# Patient Record
Sex: Female | Born: 2000 | Hispanic: Yes | Marital: Single | Smoking: Former smoker
Health system: Southern US, Community
[De-identification: ages and names within clinical notes are randomized; demographics above are authoritative.]

## PROBLEM LIST (undated history)

## (undated) DIAGNOSIS — R55 Syncope and collapse: Secondary | ICD-10-CM

## (undated) DIAGNOSIS — N390 Urinary tract infection, site not specified: Secondary | ICD-10-CM

## (undated) DIAGNOSIS — F329 Major depressive disorder, single episode, unspecified: Secondary | ICD-10-CM

## (undated) DIAGNOSIS — Z789 Other specified health status: Secondary | ICD-10-CM

## (undated) DIAGNOSIS — R300 Dysuria: Secondary | ICD-10-CM

## (undated) DIAGNOSIS — B349 Viral infection, unspecified: Secondary | ICD-10-CM

## (undated) DIAGNOSIS — U071 COVID-19: Secondary | ICD-10-CM

## (undated) DIAGNOSIS — R4586 Emotional lability: Secondary | ICD-10-CM

## (undated) HISTORY — DX: Urinary tract infection, site not specified: N39.0

## (undated) HISTORY — DX: Viral infection, unspecified: B34.9

## (undated) HISTORY — DX: Emotional lability: R45.86

## (undated) HISTORY — PX: NO PAST SURGERIES: SHX2092

## (undated) HISTORY — DX: Other specified health status: Z78.9

## (undated) HISTORY — PX: EYE EXAMINATION UNDER ANESTHESIA: SHX1560

## (undated) HISTORY — DX: COVID-19: U07.1

## (undated) HISTORY — DX: Dysuria: R30.0

## (undated) HISTORY — DX: Major depressive disorder, single episode, unspecified: F32.9

## (undated) HISTORY — DX: Syncope and collapse: R55

---
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
## 2021-06-02 ENCOUNTER — Other Ambulatory Visit: Payer: Self-pay

## 2021-06-02 ENCOUNTER — Ambulatory Visit (INDEPENDENT_AMBULATORY_CARE_PROVIDER_SITE_OTHER): Payer: Self-pay

## 2021-06-02 VITALS — BP 109/71 | HR 80 | Temp 98.2°F | Ht 62.0 in | Wt 117.0 lb

## 2021-06-02 DIAGNOSIS — Z3480 Encounter for supervision of other normal pregnancy, unspecified trimester: Secondary | ICD-10-CM

## 2021-06-02 DIAGNOSIS — Z3A19 19 weeks gestation of pregnancy: Secondary | ICD-10-CM

## 2021-06-02 MED ORDER — PREPLUS 27-1 MG PO TABS: 1.0000 | ORAL_TABLET | Freq: Every day | ORAL | 12 refills | Status: DC

## 2021-06-02 NOTE — Progress Notes (Signed)
at   I discussed the limitations, risks, security and privacy concerns of performing an evaluation and management service by telephone and the availability of in person appointments. I also discussed with the patient that there may be a patient responsible charge related to this service. The patient expressed understanding and agreed to proceed.   Location: [facility] Patient: clinic Provider: clinic   PRENATAL INTAKE SUMMARY  [name] presents today New OB Nurse Interview.  OB History     Gravida  1   Para      Term      Preterm      AB      Living         SAB      IAB      Ectopic      Multiple      Live Births             I have reviewed the patient's medical, obstetrical, social, and family histories, medications, and available lab results.  SUBJECTIVE She has no unusual complaints  OBJECTIVE Initial Nurse interview for history/labs (New OB)  last menstrual period date: 01/17/2021 EDD: 10/24/2021  GA: [redacted]w[redacted]d G1P0 FHT: 149  GENERAL APPEARANCE: alert, well appearing, in no apparent distress, oriented to person, place and time   ASSESSMENT Normal pregnancy  PLAN Prenatal care:  [facility] Pnl/HIV/Hep C OB Urine Culture GC/CT to be done at next visit  PAPapproximate date 04/2021 and was normal will bring in results to be scanned HgbEval/SMA/CF ([facility]) Panorama A1C   Blood Pressure Monitor/Weight Scale BP cuff given to pt  Weight Scale at home  MyChart/Babyscripts MyChart access verified. I explained pt will have some visits in office and some virtually. Babyscripts instructions given and order placed.   Placed OB Box on problem list and updated   Followup with Raelyn Mora, CNM on 06/12/2021 at 3:55   Follow Up Instructions:   I discussed the assessment and treatment plan with the patient. The patient was provided an opportunity to ask questions and all were answered. The patient agreed with the plan and  demonstrated an understanding of the instructions.   The patient was advised to call back or seek an in-person evaluation if the symptoms worsen or if the condition fails to improve as anticipated.  I provided 40 minutes of  face-to-face time during this encounter.  Gearldine Shown, CMA

## 2021-06-02 NOTE — Patient Instructions (Signed)
 Please remember that if you are not able to keep your appointment to call the office and reschedule, or cancel. If you no-show or cancel with less than 24 hour notice we will charge you, not your insurance a $50 fee.   AREA PEDIATRIC/FAMILY PRACTICE PHYSICIANS  Tammie Scott [address] [place], [place] [address] Phone - [phone]   Fax - [phone]  Tammie Scott [address] [place], [place]  [address] Phone - [phone]   Fax - [phone]  [facility] [address] [place], [place]  [address] Phone - [phone]   Fax - [phone]  [facility] [address] [place], [place]  [address] Phone - [phone]   Fax - [phone]  [facility] [address] [place], [place]  [address] Phone - [phone]   Fax - [phone]  [facility] [address], [address] [place], [place]  [address] Phone - [phone]   Fax - [phone]  [facility] [address] [place], [place]  [address] Phone - [phone]   Fax - [phone]  [facility] [address], [address] [place], [place]  [address] Phone - [phone]   Fax - [phone]  [facility] [address] [place], [place]  [address] Phone - [phone]   Fax - [phone] [facility] [address] [place], [place]  [address] Phone - [phone]   Fax - [phone]  [facility] [address] [place], [place]  [address] Phone - [phone]   Fax - [phone]  [facility] [address] [place], [place]  [address] Phone - [phone]   Fax - [phone]  [facility] [address] [place], [place]  [address] Phone - [phone]   Fax - [phone]  Tammie Scott [address] [place], [place]  [address] Phone - [phone]  [facility] [address] [place], [place]  [address] Phone - [phone]   Fax - [phone]  [facility] [address] [place], [place]  [address] Phone - [phone]   Fax - [phone]  [facility] [address] [place], [place]  [address] Phone - [phone]   Fax - [phone]  [facility] [address] [place], [place]  [address] Phone - [phone]   Fax - [phone]   [facility] [address] [place], [place]  [address] Phone - [phone]   Fax - [phone]  [facility] [address] , [place]  [address] Phone - [phone]   Fax - [phone]  Tammie Scott [address] , [place]  [address] Phone - [phone]   Fax - [phone]  [facility] [address] , [place]  [address] Phone - [phone]   Fax - [phone]  [facility] [address] , [place]  [address] Phone - [phone]   Fax - [phone] [facility] [address] [place], [place]  [address] Phone - [phone]   Fax - [phone]  [facility] [address] [place], [place]  [address] Phone - [phone]   Fax - [phone]  [facility] [address] [place],     [address] Phone - [phone]   Fax - [phone]

## 2021-06-04 LAB — OBSTETRIC PANEL, INCLUDING HIV
Antibody Screen: NEGATIVE
Basophils Absolute: 0.1 10*3/uL (ref 0.0–0.2)
Basos: 1 %
EOS (ABSOLUTE): 0.1 10*3/uL (ref 0.0–0.4)
Eos: 1 %
HIV Screen 4th Generation wRfx: NONREACTIVE
Hematocrit: 40.1 % (ref 34.0–46.6)
Hemoglobin: 13.9 g/dL (ref 11.1–15.9)
Hepatitis B Surface Ag: NEGATIVE
Immature Grans (Abs): 0 10*3/uL (ref 0.0–0.1)
Immature Granulocytes: 0 %
Lymphocytes Absolute: 1.5 10*3/uL (ref 0.7–3.1)
Lymphs: 19 %
MCH: 32 pg (ref 26.6–33.0)
MCHC: 34.7 g/dL (ref 31.5–35.7)
MCV: 92 fL (ref 79–97)
Monocytes Absolute: 0.4 10*3/uL (ref 0.1–0.9)
Monocytes: 5 %
Neutrophils Absolute: 5.9 10*3/uL (ref 1.4–7.0)
Neutrophils: 74 %
Platelets: 142 10*3/uL — ABNORMAL LOW (ref 150–450)
RBC: 4.34 x10E6/uL (ref 3.77–5.28)
RDW: 13.1 % (ref 11.7–15.4)
RPR Ser Ql: REACTIVE — AB
Rh Factor: POSITIVE
Rubella Antibodies, IGG: 1.86 index (ref >0.99)
WBC: 8 10*3/uL (ref 3.4–10.8)

## 2021-06-04 LAB — HEMOGLOBIN A1C
Est. average glucose Bld gHb Est-mCnc: 100 mg/dL
Hgb A1c MFr Bld: 5.1 % (ref 4.8–5.6)

## 2021-06-04 LAB — URINE CULTURE, OB REFLEX

## 2021-06-04 LAB — RPR, QUANT+TP ABS (REFLEX)
Rapid Plasma Reagin, Quant: 1:2 {titer} — ABNORMAL HIGH
T Pallidum Abs: NONREACTIVE

## 2021-06-04 LAB — CULTURE, OB URINE

## 2021-06-04 LAB — HEPATITIS C ANTIBODY: Hep C Virus Ab: 0.1 s/co ratio (ref 0.0–0.9)

## 2021-06-10 ENCOUNTER — Emergency Department (HOSPITAL_COMMUNITY)
Admission: EM | Admit: 2021-06-10 | Discharge: 2021-06-11 | Disposition: A | Discharge status: Home / Self Care | Payer: Self-pay | Attending: Emergency Medicine | Admitting: Emergency Medicine

## 2021-06-10 ENCOUNTER — Emergency Department (HOSPITAL_BASED_OUTPATIENT_CLINIC_OR_DEPARTMENT_OTHER): Payer: Self-pay

## 2021-06-10 ENCOUNTER — Emergency Department (HOSPITAL_COMMUNITY): Payer: Self-pay

## 2021-06-10 ENCOUNTER — Other Ambulatory Visit: Payer: Self-pay

## 2021-06-10 ENCOUNTER — Encounter (HOSPITAL_COMMUNITY): Payer: Self-pay

## 2021-06-10 DIAGNOSIS — X58XXXA Exposure to other specified factors, initial encounter: Secondary | ICD-10-CM | POA: Insufficient documentation

## 2021-06-10 DIAGNOSIS — R55 Syncope and collapse: Secondary | ICD-10-CM | POA: Insufficient documentation

## 2021-06-10 DIAGNOSIS — Z3A20 20 weeks gestation of pregnancy: Secondary | ICD-10-CM

## 2021-06-10 DIAGNOSIS — O9A219 Injury, poisoning and certain other consequences of external causes complicating pregnancy, unspecified trimester: Secondary | ICD-10-CM

## 2021-06-10 DIAGNOSIS — R61 Generalized hyperhidrosis: Secondary | ICD-10-CM | POA: Insufficient documentation

## 2021-06-10 DIAGNOSIS — S0093XA Contusion of unspecified part of head, initial encounter: Secondary | ICD-10-CM | POA: Insufficient documentation

## 2021-06-10 DIAGNOSIS — R1032 Left lower quadrant pain: Secondary | ICD-10-CM | POA: Insufficient documentation

## 2021-06-10 DIAGNOSIS — O26892 Other specified pregnancy related conditions, second trimester: Secondary | ICD-10-CM | POA: Insufficient documentation

## 2021-06-10 DIAGNOSIS — O9A212 Injury, poisoning and certain other consequences of external causes complicating pregnancy, second trimester: Secondary | ICD-10-CM

## 2021-06-10 DIAGNOSIS — Y9259 Other trade areas as the place of occurrence of the external cause: Secondary | ICD-10-CM | POA: Insufficient documentation

## 2021-06-10 DIAGNOSIS — Z87891 Personal history of nicotine dependence: Secondary | ICD-10-CM | POA: Insufficient documentation

## 2021-06-10 LAB — CBC WITH DIFFERENTIAL/PLATELET
Abs Immature Granulocytes: 0.02 10*3/uL (ref 0.00–0.07)
Basophils Absolute: 0 10*3/uL (ref 0.0–0.1)
Basophils Relative: 1 %
Eosinophils Absolute: 0 10*3/uL (ref 0.0–0.5)
Eosinophils Relative: 1 %
HCT: 37.6 % (ref 36.0–46.0)
Hemoglobin: 12.9 g/dL (ref 12.0–15.0)
Immature Granulocytes: 0 %
Lymphocytes Relative: 21 %
Lymphs Abs: 1.5 10*3/uL (ref 0.7–4.0)
MCH: 32.6 pg (ref 26.0–34.0)
MCHC: 34.3 g/dL (ref 30.0–36.0)
MCV: 94.9 fL (ref 80.0–100.0)
Monocytes Absolute: 0.4 10*3/uL (ref 0.1–1.0)
Monocytes Relative: 6 %
Neutro Abs: 5.1 10*3/uL (ref 1.7–7.7)
Neutrophils Relative %: 71 %
Platelets: 127 10*3/uL — ABNORMAL LOW (ref 150–400)
RBC: 3.96 MIL/uL (ref 3.87–5.11)
RDW: 13.1 % (ref 11.5–15.5)
WBC: 7.2 10*3/uL (ref 4.0–10.5)
nRBC: 0 % (ref 0.0–0.2)

## 2021-06-10 LAB — BASIC METABOLIC PANEL
Anion gap: 9 (ref 5–15)
BUN: 5 mg/dL — ABNORMAL LOW (ref 6–20)
CO2: 24 mmol/L (ref 22–32)
Calcium: 8.7 mg/dL — ABNORMAL LOW (ref 8.9–10.3)
Chloride: 103 mmol/L (ref 98–111)
Creatinine, Ser: 0.42 mg/dL — ABNORMAL LOW (ref 0.44–1.00)
GFR, Estimated: >60 mL/min (ref >60)
Glucose, Bld: 71 mg/dL (ref 70–99)
Potassium: 3.7 mmol/L (ref 3.5–5.1)
Sodium: 136 mmol/L (ref 135–145)

## 2021-06-10 MED ORDER — LACTATED RINGERS IV BOLUS
1000.0000 mL | Freq: Once | INTRAVENOUS | Status: AC
  Administered 2021-06-10: 1000 mL via INTRAVENOUS

## 2021-06-10 NOTE — ED Triage Notes (Signed)
Patient is [redacted] weeks pregnant and had syncopal episode today at drugstore. Patient states that she hasn't felt baby move since fall. Hematoma to back of head. Denies leakage, denis bleeding, reports mild abdominal pain. FHT at triage 160

## 2021-06-10 NOTE — ED Provider Notes (Signed)
[facility] EMERGENCY DEPARTMENT Provider Note   CSN: [id number] Arrival date & time: 06/10/21  1729     History No chief complaint on file.   Tammie Scott is a 20 y.o. female.  Patient who is [redacted] weeks gestation presents today with chief complaint of syncope. She states that same occurred while she was at the [facility] pharmacy earlier today, she started having left lower quadrant abdominal pain, diaphoresis, and tunnel vision and next thing she knew she woke up on the floor. No history of similar, she did not bite her tongue and did not urinate on herself. She states that bystanders told her she was unconscious for less than a minute and that she struck her head on the floor. No seizure like activity endorsed by bystanders. She is now complaining of headache and left lower quadrant abdominal pain. Endorses decreased fetal movement following, but no vaginal bleeding or discharge. She is G1P0 and has had OB/GYN following by [facility].  The history is provided by the patient. No language interpreter was used.      Past Medical History:  Diagnosis Date   Medical history non-contributory     Patient Active Problem List   Diagnosis Date Noted   Supervision of other normal pregnancy, antepartum 06/02/2021    Past Surgical History:  Procedure Laterality Date   NO PAST SURGERIES       OB History     Gravida  1   Para      Term      Preterm      AB      Living         SAB      IAB      Ectopic      Multiple      Live Births              Family History  Problem Relation Age of Onset   Epilepsy Brother    Asthma Brother    Diabetes Maternal Grandfather     Social History   Tobacco Use   Smoking status: Former    Types: Cigarettes    Quit date: 09/2020    Years since quitting: 0.7   Smokeless tobacco: Never  Vaping Use   Vaping Use: Never used  Substance Use Topics   Alcohol use: Never   Drug use: Not  Currently    Types: Marijuana    Comment: Pt hasn't smoked in years    Home Medications Prior to Admission medications   Medication Sig Start Date End Date Taking? Authorizing Provider  phenazopyridine (PYRIDIUM) 200 MG tablet Take 1 tablet (200 mg total) by mouth 3 (three) times daily. Patient not taking: Reported on 06/02/2021 08/13/17   Blue, Marylene Land, PA-C  Prenatal Vit-Fe Fumarate-FA (MULTIVITAMIN-PRENATAL) 27-0.8 MG TABS tablet Take 1 tablet by mouth daily at 12 noon.    [provider]  Prenatal Vit-Fe Fumarate-FA (PREPLUS) 27-1 MG TABS Take 1 tablet by mouth daily. 06/02/21   Raelyn Mora, CNM    Allergies    Patient has no known allergies.  Review of Systems   Review of Systems  Constitutional:  Negative for chills and fever.  Respiratory:  Negative for cough and shortness of breath.   Cardiovascular:  Negative for chest pain.  Gastrointestinal:  Positive for abdominal pain. Negative for diarrhea, nausea and vomiting.  Genitourinary:  Negative for decreased urine volume, dysuria, flank pain, vaginal bleeding, vaginal discharge and vaginal pain.  Musculoskeletal:  Negative for back pain, gait problem, neck pain and neck stiffness.  Neurological:  Positive for syncope and headaches. Negative for dizziness, tremors, facial asymmetry, speech difficulty, weakness, light-headedness and numbness.  Psychiatric/Behavioral:  Negative for confusion and decreased concentration.   All other systems reviewed and are negative.  Physical Exam Updated Vital Signs BP 105/69   Pulse 85   Temp 98.3 F (36.8 C)   Resp 16   LMP 01/17/2021 (Exact Date)   SpO2 100%   Physical Exam Vitals and nursing note reviewed.  Constitutional:      Appearance: Normal appearance. She is normal weight.  HENT:     Head:     Comments: Small hematoma noted to the back of the head with moderate tenderness. Eyes:     Extraocular Movements: Extraocular movements intact.     Pupils: Pupils are  equal, round, and reactive to light.  Cardiovascular:     Rate and Rhythm: Normal rate and regular rhythm.     Heart sounds: Normal heart sounds.  Pulmonary:     Effort: Pulmonary effort is normal. No respiratory distress.     Breath sounds: Normal breath sounds.  Abdominal:     Tenderness: There is no right CVA tenderness, left CVA tenderness, guarding or rebound.     Comments: Gravid abdomen with some tenderness noted in the left lower quadrant  Musculoskeletal:        General: Normal range of motion.     Cervical back: Normal range of motion and neck supple.  Skin:    General: Skin is warm and dry.  Neurological:     General: No focal deficit present.     Mental Status: She is alert.     Gait: Gait normal.  Psychiatric:        Mood and Affect: Mood normal.        Behavior: Behavior normal.    ED Results / Procedures / Treatments   Labs (all labs ordered are listed, but only abnormal results are displayed) Labs Reviewed  CBC WITH DIFFERENTIAL/PLATELET - Abnormal; Notable for the following components:      Result Value   Platelets 127 (*)    All other components within normal limits  BASIC METABOLIC PANEL - Abnormal; Notable for the following components:   BUN 5 (*)    Creatinine, Ser 0.42 (*)    Calcium 8.7 (*)    All other components within normal limits    EKG None  Radiology CT Head Wo Contrast  Result Date: 06/10/2021 CLINICAL DATA:  Recent syncopal episode, initial encounter EXAM: CT HEAD WITHOUT CONTRAST TECHNIQUE: Contiguous axial images were obtained from the base of the skull through the vertex without intravenous contrast. COMPARISON:  None. FINDINGS: Brain: No evidence of acute infarction, hemorrhage, hydrocephalus, extra-axial collection or mass lesion/mass effect. Vascular: No hyperdense vessel or unexpected calcification. Skull: Normal. Negative for fracture or focal lesion. Sinuses/Orbits: No acute finding. Other: None. IMPRESSION: No acute intracranial  abnormality noted. Electronically Signed   By: Alcide Clever M.D.   On: 06/10/2021 23:16    Procedures Procedures   Medications Ordered in ED Medications  lactated ringers bolus 1,000 mL (0 mLs Intravenous Stopped 06/10/21 2322)    ED Course  I have reviewed the triage vital signs and the nursing notes.  Pertinent labs & imaging results that were available during my care of the patient were reviewed by me and considered in my medical decision making (see chart for details).  MDM Rules/Calculators/A&P                         Patient [redacted] weeks gestation presents today following syncopal episode. Triage APP discussed with [facility] APP who states given only 20 weeks that she should be managed in our ED. She endorses headache with left lower quadrant abdominal pain. Labs without leukocytosis or anemia, mild thrombocytopenia at 127 consistent with pregnancy. No electrolyte abnormalities. Vital signs stable without hypertension. Given pain and decreased movement, will get fetal US for evaluation of cardiac activity and rule out placenta previa or abruption. Additionally, discussed with patient in the presence of unclear presentation of syncope with significant headache and hematoma risks vs benefits of CT imaging. Patient understanding and amenable with head CT.  CT head with acute abnormalities. US reveals normal fetal heart rate at 145 bpm with observed cardiac activity. No placental abruption or previa identified. No other acute abnormalities or concerns.  After reevaluation and fluid bolus administration, patient states she is feeling some better. Now endorses some left hip pain which she suspects is from her fall during her syncopal episode, however she is observed to be ambulatory with full ROM, therefore will defer further imaging. No further concerns at this time, feel her syncopal episode was likely due to vasovagal syncope in the setting of pregnancy and compounding dehydration. Plan to discharge  with close OB/GYN follow-up and plenty of oral hydration. She is amenable with plan of discharge, educated on red flag symptoms that would prompt immediate return. Plan to call her OB/GYN tomorrow for appointment. She is afebrile, non-toxic appearing, and in no acute distress. Discharged in stable condition.   This is a shared visit with supervising physician Dr. Silverio Lay who has independently evaluated patient & provided guidance in evaluation/management/disposition, in agreement with care    Final Clinical Impression(s) / ED Diagnoses Final diagnoses:  Vasovagal syncope    Rx / DC Orders ED Discharge Orders     None     An After Visit Summary was printed and given to the patient.    Vear Clock 06/11/21 0053    Charlynne Pander, MD 06/17/21 [id number]

## 2021-06-10 NOTE — ED Provider Notes (Signed)
Emergency Medicine Provider Triage Evaluation Note  Tammie Scott , a 20 y.o. female  was evaluated in triage.  Pt complains of syncope episode. She is currently [redacted]w[redacted]d gestation. No history of seizures. Woke up on the floor at store and heard bystander stay "she's seizing". No urinary incontinence or mouth trauma. Admits to hitting her head. Prior to the syncopal episodes she felt hot, dizzy, and had blurry vision. No chest pain or shortness of breath. No nausea, vomiting, or diarrhea. Patient is followed by [facility]. No pregnancy complications. G1P0. No blood pressure issues thus far in pregnancy. Patient denies vaginal bleeding and fluid from the vagina. Admits to decreased fetal movement since the syncopal episode.   Review of Systems  Positive: syncope Negative: CP  Physical Exam  BP 119/73 (BP Location: Right Arm)   Pulse 81   Temp 98.2 F (36.8 C) (Oral)   Resp 18   LMP 01/17/2021 (Exact Date)   SpO2 100%  Gen:   Awake, no distress   Resp:  Normal effort  MSK:   Moves extremities without difficulty  Other:  Small hematoma to posterior aspect of head, FHR 160s  Medical Decision Making  Medically screening exam initiated at 5:40 PM.  Appropriate orders placed.  Tammie Scott was informed that the remainder of the evaluation will be completed by another provider, this initial triage assessment does not replace that evaluation, and the importance of remaining in the ED until their evaluation is complete.  Spoke to Sam with [facility] who recommends keeping patient in the ED given she is only [redacted] weeks gestation Labs ordered Normal BP in triage   Jesusita Oka 06/10/21 1746    Gwyneth Sprout, MD 06/10/21 2139

## 2021-06-11 NOTE — Discharge Instructions (Signed)
Your workup in the ED this evening was reassuring. Please ensure that you are staying hydrated and call your OB/GYN tomorrow to schedule an appointment.  Return if development of any new or worsening symptoms.

## 2021-06-12 ENCOUNTER — Encounter: Payer: Self-pay | Admitting: Obstetrics and Gynecology

## 2021-06-13 ENCOUNTER — Telehealth: Payer: Self-pay | Admitting: *Deleted

## 2021-06-13 NOTE — Telephone Encounter (Signed)
Patient had called earlier regarding flu like symptoms. Boyfriend tested positive for Flu 06/11/21. She is having bodyaches, temp 99.2, chills, coughing and sore throat. Left voice message for patient to go to urgent care or call PCP. Advised take over the counter Tylenol, Mucinex, Allegra, Zyrtec, Claritin, Benadryl, Robitussin or Sudafed.  Clovis Pu, RN

## 2021-06-14 ENCOUNTER — Ambulatory Visit (HOSPITAL_COMMUNITY)
Admission: EM | Admit: 2021-06-14 | Discharge: 2021-06-14 | Disposition: A | Discharge status: Home / Self Care | Payer: Self-pay | Attending: Medical Oncology | Admitting: Medical Oncology

## 2021-06-14 ENCOUNTER — Inpatient Hospital Stay (HOSPITAL_COMMUNITY)
Admission: AD | Admit: 2021-06-14 | Discharge: 2021-06-14 | Disposition: A | Discharge status: Home / Self Care | Payer: Self-pay | Attending: Obstetrics and Gynecology | Admitting: Obstetrics and Gynecology

## 2021-06-14 ENCOUNTER — Other Ambulatory Visit: Payer: Self-pay

## 2021-06-14 ENCOUNTER — Encounter (HOSPITAL_COMMUNITY): Payer: Self-pay

## 2021-06-14 DIAGNOSIS — Z87891 Personal history of nicotine dependence: Secondary | ICD-10-CM | POA: Insufficient documentation

## 2021-06-14 DIAGNOSIS — Z3480 Encounter for supervision of other normal pregnancy, unspecified trimester: Secondary | ICD-10-CM

## 2021-06-14 DIAGNOSIS — O26892 Other specified pregnancy related conditions, second trimester: Secondary | ICD-10-CM | POA: Insufficient documentation

## 2021-06-14 DIAGNOSIS — Z3490 Encounter for supervision of normal pregnancy, unspecified, unspecified trimester: Secondary | ICD-10-CM

## 2021-06-14 DIAGNOSIS — Z20822 Contact with and (suspected) exposure to covid-19: Secondary | ICD-10-CM | POA: Insufficient documentation

## 2021-06-14 DIAGNOSIS — Z20828 Contact with and (suspected) exposure to other viral communicable diseases: Secondary | ICD-10-CM

## 2021-06-14 DIAGNOSIS — Z3A00 Weeks of gestation of pregnancy not specified: Secondary | ICD-10-CM

## 2021-06-14 DIAGNOSIS — O99512 Diseases of the respiratory system complicating pregnancy, second trimester: Secondary | ICD-10-CM | POA: Insufficient documentation

## 2021-06-14 DIAGNOSIS — Z3A21 21 weeks gestation of pregnancy: Secondary | ICD-10-CM | POA: Insufficient documentation

## 2021-06-14 DIAGNOSIS — J101 Influenza due to other identified influenza virus with other respiratory manifestations: Secondary | ICD-10-CM | POA: Insufficient documentation

## 2021-06-14 DIAGNOSIS — J111 Influenza due to unidentified influenza virus with other respiratory manifestations: Secondary | ICD-10-CM

## 2021-06-14 DIAGNOSIS — R1031 Right lower quadrant pain: Secondary | ICD-10-CM

## 2021-06-14 DIAGNOSIS — R109 Unspecified abdominal pain: Secondary | ICD-10-CM | POA: Insufficient documentation

## 2021-06-14 DIAGNOSIS — O98519 Other viral diseases complicating pregnancy, unspecified trimester: Secondary | ICD-10-CM

## 2021-06-14 LAB — RESP PANEL BY RT-PCR (FLU A&B, COVID) ARPGX2
Influenza A by PCR: POSITIVE — AB
Influenza B by PCR: NEGATIVE
SARS Coronavirus 2 by RT PCR: NEGATIVE

## 2021-06-14 MED ORDER — OSELTAMIVIR PHOSPHATE 75 MG PO CAPS: 75.0000 mg | ORAL_CAPSULE | Freq: Two times a day (BID) | ORAL | 0 refills | Status: DC

## 2021-06-14 MED ORDER — OSELTAMIVIR PHOSPHATE 75 MG PO CAPS
75.0000 mg | ORAL_CAPSULE | Freq: Two times a day (BID) | ORAL | 0 refills | Status: AC
Start: 2021-06-14 — End: 2021-06-19

## 2021-06-14 NOTE — [facility] Note (Signed)
Flu like symptoms since Thurs night.  One of her relatives tested +. Headache, cough, sore throat, runny nose and body aches, fever.  Highest was this morning, it was 100.0. took some Tylenol this morning.   They didn't swab her, but they prescribed Tamiflu.  They sent her here because she was having some abd discomfort. Lower right, comes and goes.

## 2021-06-14 NOTE — ED Triage Notes (Signed)
Pt here with sore throat, cough and congestion x 3 days. She was expose to the flu.

## 2021-06-14 NOTE — Discharge Instructions (Addendum)

## 2021-06-14 NOTE — ED Provider Notes (Signed)
Emergency Medicine Provider Triage Evaluation Note  Tammie Scott , a 20 y.o. female  was evaluated in triage.  Pt complains of fevers, cough, sore throat, headache, and body aches.  She had a close contact who tested positive for flu.  This started Thursday night.   She reports that same day she started having RLQ abdominal pain.   No bleeding or spotting, no loss of fluids, no dysuria. No abnormal discharge.   She was seen at urgent care who called [facility] however patient accidentally came through the ER instead.  Review of Systems  Positive: Right lower quadrant abdominal pain, fevers, headache, body ache, cough, sore throat Negative: Vaginal bleeding/spotting, loss of fluids.  Physical Exam  BP 106/67   Pulse (!) 104   Temp 99 F (37.2 C) (Oral)   Resp 14   Ht 5\' 2"  (1.575 m)   Wt 53.1 kg   LMP 01/17/2021 (Exact Date)   SpO2 99%   BMI 21.41 kg/m  Gen:   Awake, no distress   Resp:  Normal effort  MSK:   Moves extremities without difficulty  Other:  Right lower quadrant abdominal tenderness to palpation.  Medical Decision Making  Medically screening exam initiated at 4:38 PM.  Appropriate orders placed.  Tammie Scott was informed that the remainder of the evaluation will be completed by another provider, this initial triage assessment does not replace that evaluation, and the importance of remaining in the ED until their evaluation is complete.  Heart rate was dopplered at 155. I spoke with Erin at [facility] who accepts patient for transfer.  Note: Portions of this report may have been transcribed using voice recognition software. Every effort was made to ensure accuracy; however, inadvertent computerized transcription errors may be present    Janyth Contes 06/14/21 1647    14/10/22, MD 06/16/21 2342

## 2021-06-14 NOTE — Discharge Instructions (Addendum)
Please go to [facility] for further work up of abdominal pain

## 2021-06-14 NOTE — ED Triage Notes (Addendum)
Pt arrived via POV from UC for 5/10 sharp pain in RLQ of abdomenx2 days. Pt states she is [redacted] wks pregnant. EDD 10/24/2021. Pt c/o nausea, but denies vomiting or diarrhea. Pt denies vaginal bleeding and states she feels the baby moving around.

## 2021-06-14 NOTE — ED Provider Notes (Signed)
[facility]    CSN: [id number] Arrival date & time: 06/14/21  1033      History   Chief Complaint Chief Complaint  Patient presents with   Fever    Chills and cough    HPI Tammie Scott is a 20 y.o. female.   HPI  Fever: Patient who is currently [redacted] weeks pregnant states that for the past 3 days she has had sore throat, cough and nasal congestion.  She also has had a fever with T-max of 100 F.  She states that her mother who she is in close contact with tested flu for this week and has similar symptoms.  She has been trying to stay hydrated with water and Pedialyte for symptoms with some improvement.  She denies any shortness of breath chest pain decreased fetal movement, dysuria or urinary frequency.  She does mention at the end of our visit that she has been having some right lower quadrant abdominal pain that started off and on a few weeks ago but has been more prevalent over the past few days while she has been sick.  Past Medical History:  Diagnosis Date   Medical history non-contributory     Patient Active Problem List   Diagnosis Date Noted   Supervision of other normal pregnancy, antepartum 06/02/2021    Past Surgical History:  Procedure Laterality Date   NO PAST SURGERIES      OB History     Gravida  1   Para      Term      Preterm      AB      Living         SAB      IAB      Ectopic      Multiple      Live Births               Home Medications    Prior to Admission medications   Medication Sig Start Date End Date Taking? Authorizing Provider  phenazopyridine (PYRIDIUM) 200 MG tablet Take 1 tablet (200 mg total) by mouth 3 (three) times daily. Patient not taking: Reported on 06/02/2021 08/13/17   Blue, Marylene Land, PA-C  Prenatal Vit-Fe Fumarate-FA (MULTIVITAMIN-PRENATAL) 27-0.8 MG TABS tablet Take 1 tablet by mouth daily at 12 noon.    [provider]  Prenatal Vit-Fe Fumarate-FA (PREPLUS) 27-1 MG TABS Take 1  tablet by mouth daily. 06/02/21   Raelyn Mora, CNM    Family History Family History  Problem Relation Age of Onset   Epilepsy Brother    Asthma Brother    Diabetes Maternal Grandfather     Social History Social History   Tobacco Use   Smoking status: Former    Types: Cigarettes    Quit date: 09/2020    Years since quitting: 0.7   Smokeless tobacco: Never  Vaping Use   Vaping Use: Never used  Substance Use Topics   Alcohol use: Never   Drug use: Not Currently    Types: Marijuana    Comment: Pt hasn't smoked in years     Allergies   Patient has no known allergies.   Review of Systems Review of Systems  As stated above in HPI Physical Exam Triage Vital Signs ED Triage Vitals [06/14/21 1221]  Enc Vitals Group     BP 121/78     Pulse Rate 78     Resp      Temp (!) 97.1 F (36.2 C)  Temp Source Oral     SpO2 100 %     Weight      Height      Head Circumference      Peak Flow      Pain Score 0     Pain Loc      Pain Edu?      Excl. in GC?    No data found.  Updated Vital Signs BP 121/78 (BP Location: Left Arm)   Pulse 78   Temp (!) 97.1 F (36.2 C) (Oral)   LMP 01/17/2021 (Exact Date)   SpO2 100%   Physical Exam Vitals and nursing note reviewed.  Constitutional:      General: She is not in acute distress.    Appearance: Normal appearance. She is not ill-appearing, toxic-appearing or diaphoretic.  HENT:     Head: Normocephalic and atraumatic.     Right Ear: Tympanic membrane normal.     Left Ear: Tympanic membrane normal.     Nose: Congestion present.     Mouth/Throat:     Mouth: Mucous membranes are moist.     Pharynx: Oropharynx is clear. No oropharyngeal exudate or posterior oropharyngeal erythema.  Eyes:     Extraocular Movements: Extraocular movements intact.     Pupils: Pupils are equal, round, and reactive to light.  Cardiovascular:     Rate and Rhythm: Normal rate and regular rhythm.     Heart sounds: Normal heart sounds.   Pulmonary:     Effort: Pulmonary effort is normal.     Breath sounds: Normal breath sounds.  Abdominal:     General: Bowel sounds are normal. There is no distension.     Tenderness: There is abdominal tenderness (Throughout but worse in RLQ. Guarding present and mild rebound tenderness. Negative psoas sign).  Musculoskeletal:     Cervical back: Normal range of motion and neck supple.  Lymphadenopathy:     Cervical: No cervical adenopathy.  Skin:    General: Skin is warm.  Neurological:     Mental Status: She is alert and oriented to person, place, and time.     UC Treatments / Results  Labs (all labs ordered are listed, but only abnormal results are displayed) Labs Reviewed - No data to display  EKG   Radiology No results found.  Procedures Procedures (including critical care time)  Medications Ordered in UC Medications - No data to display  Initial Impression / Assessment and Plan / UC Course  I have reviewed the triage vital signs and the nursing notes.  Pertinent labs & imaging results that were available during my care of the patient were reviewed by me and considered in my medical decision making (see chart for details).     New.  Likely does have influenza but we discussed that given her abdominal pain with current pregnancy that she will need to have further evaluation in the emergency room.  She was directed to [facility] and providers there were alerted to her upcoming arrival. NPO status discussed.  Final Clinical Impressions(s) / UC Diagnoses   Final diagnoses:  None   Discharge Instructions   None    ED Prescriptions   None    PDMP not reviewed this encounter.   Rushie Chestnut, [place] 06/14/21 1416

## 2021-06-14 NOTE — ED Notes (Signed)
Patient is being discharged from the Urgent Care and sent to the Emergency Department via POV . Per Maralyn Sago, [place], patient is in need of higher level of care due to abdominal pain in pregnancy. Patient is aware and verbalizes understanding of plan of care.  Vitals:   06/14/21 1221  BP: 121/78  Pulse: 78  Temp: (!) 97.1 F (36.2 C)  SpO2: 100%
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
## 2021-06-20 ENCOUNTER — Other Ambulatory Visit (HOSPITAL_COMMUNITY)
Admission: RE | Admit: 2021-06-20 | Discharge: 2021-06-20 | Disposition: A | Discharge status: Home / Self Care | Payer: Self-pay | Source: Ambulatory Visit | Attending: Obstetrics and Gynecology | Admitting: Obstetrics and Gynecology

## 2021-06-20 ENCOUNTER — Ambulatory Visit (INDEPENDENT_AMBULATORY_CARE_PROVIDER_SITE_OTHER): Payer: Self-pay | Admitting: Obstetrics and Gynecology

## 2021-06-20 ENCOUNTER — Other Ambulatory Visit: Payer: Self-pay

## 2021-06-20 ENCOUNTER — Encounter: Payer: Self-pay | Admitting: Obstetrics and Gynecology

## 2021-06-20 VITALS — BP 107/64 | HR 78 | Temp 97.6°F | Wt 118.6 lb

## 2021-06-20 DIAGNOSIS — Z3A22 22 weeks gestation of pregnancy: Secondary | ICD-10-CM | POA: Insufficient documentation

## 2021-06-20 DIAGNOSIS — Z3480 Encounter for supervision of other normal pregnancy, unspecified trimester: Secondary | ICD-10-CM

## 2021-06-20 DIAGNOSIS — A530 Latent syphilis, unspecified as early or late: Secondary | ICD-10-CM

## 2021-06-20 NOTE — Progress Notes (Signed)
INITIAL OBSTETRICAL VISIT Patient name: Tammie Scott MRN [id number]  Date of birth: 09/30/2000 Chief Complaint:   Initial Prenatal Visit  History of Present Illness:   Tammie Scott is a 20 y.o. G1P0 Hispanic female at [redacted]w[redacted]d by LMP with an Estimated Date of Delivery: 10/24/21 being seen today for her initial obstetrical visit.  Her obstetrical history is significant for  syncopal episode with unknown etiology . This is a planned pregnancy. She and the father of the baby (FOB) "Erby Pian" do not live together, but he is involved. Both are happy to be having a little girl. She has a support system that consists of FOB/family/friends. Today she reports no complaints.   Patient's last menstrual period was 01/17/2021 (exact date). Last pap 05/2021. Results were:  unknown - will get results faxed from office Review of Systems:   Pertinent items are noted in HPI Denies cramping/contractions, leakage of fluid, vaginal bleeding, abnormal vaginal discharge w/ itching/odor/irritation, headaches, visual changes, shortness of breath, chest pain, abdominal pain, severe nausea/vomiting, or problems with urination or bowel movements unless otherwise stated above.  Pertinent History Reviewed:  Reviewed past medical,surgical, social, obstetrical and family history.  Reviewed problem list, medications and allergies. OB History  Gravida Para Term Preterm AB Living  1            SAB IAB Ectopic Multiple Live Births               # Outcome Date GA Lbr Len/2nd Weight Sex Delivery Anes PTL Lv  1 Current            Physical Assessment:   Vitals:   06/20/21 0819  BP: 107/64  Pulse: 78  Temp: 97.6 F (36.4 C)  Weight: 118 lb 9.6 oz (53.8 kg)  Body mass index is 21.69 kg/m.       Physical Examination:  General appearance - well appearing, and in no distress  Mental status - alert, oriented to person, place, and time  Psych:  She has a normal mood and affect  Skin - warm and dry, normal color, no  suspicious lesions noted  Chest - effort normal, all lung fields clear to auscultation bilaterally  Heart - normal rate and regular rhythm  Abdomen - soft, nontender  Extremities:  No swelling or varicosities noted  Pelvic - deferred  Thin prep pap is not done   FHTs by doppler: 145 bpm  Assessment & Plan:  1) Low-Risk Pregnancy G1P0 at [redacted]w[redacted]d with an Estimated Date of Delivery: 10/24/21   2) Initial OB visit - Welcomed to practice and introduced self to patient in addition to discussing other advanced practice providers that she may be seeing at this practice - Congratulated patient - Anticipatory guidance on upcoming appointments - Educated on COVID19 and pregnancy and the integration of virtual appointments  - Educated on babyscripts app- patient reports she has not received email, encouraged to look in spam folder and to call office if she still has not received email - patient verbalizes understanding - Reviewed normal prenatal lab results - Advised of low platelet count -- no additional management at this time. Will be re-checked at 28 wk visit.    3) Supervision of other normal pregnancy, antepartum - Anticipatory guidance for 2 hr GTT - advised to fast after midnight without anything to eat or drink (except for water), will have fasting blood drawn, drink the glucola drink (flavor choices: orange, fruit punch or lemon-lime), have a visit with a provider during the first  hour of testing, wait in the lab waiting room to have blood drawn at 1 hour and then 2 hours after finishing glucola drink.   4) [redacted] weeks gestation of pregnancy - Urine cytology ancillary only([place]) - [place] MFM OB COMP + 14 WK; Future - RPR  5) Positive RPR test - Negative T.Pallidum result >> false positive RPR - Explained to patient the test does not need to be repeated today, but will be repeated at 28 wks and when she is admitted to the hospital  Meds: No orders of the defined types were placed in this  encounter.   Initial labs obtained Continue prenatal vitamins Reviewed n/v relief measures and warning s/s to report Reviewed recommended weight gain based on pre-gravid BMI Encouraged well-balanced diet Genetic Screening discussed: results reviewed Cystic fibrosis, SMA, Fragile X screening discussed results reviewed The nature of [facility] with multiple MDs and other Advanced Practice Providers was explained to patient; also emphasized that residents, students are part of our team.  Discussed optimized OB schedule and video visits. Advised can have an in-office visit whenever she feels she needs to be seen.  Does own BP cuff. Check BP weekly, let us know if >140/90. Advised to call during normal business hours and there is an after-hours nurse line available.    Follow-up: Return in about 6 weeks (around 08/01/2021) for Return OB 2hr GTT.   Orders Placed This Encounter  Procedures   [place] MFM OB COMP + 14 WK    Raelyn Mora MSN, [place] 06/20/2021

## 2021-06-23 LAB — URINE CYTOLOGY ANCILLARY ONLY
Chlamydia: NEGATIVE
Comment: NEGATIVE
Comment: NEGATIVE
Comment: NORMAL
Neisseria Gonorrhea: NEGATIVE
Trichomonas: NEGATIVE

## 2021-07-06 NOTE — L&D Delivery Note (Signed)
OB/GYN Faculty Practice Delivery Note ? ?[name] is a 21 y.o. G1P1001 s/p SVD at [redacted]w[redacted]d. She was admitted for spontaneous onset of labor.  ? ?ROM: 0h 56m with clear fluid ?GBS Status: negative ?Maximum Maternal Temperature: 98 ? ?Labor Progress: ?Presented in spontaneous labor (early) and progressed to 5 cm. Ultimately was started on pitocin which was uptitrated and she progressed to complete. ? ?Delivery Date/Time: 10/20/2021 on 1305 ?Delivery: Called to room and patient was complete and pushing. Head delivered OA. Nuchal cord x1  present. Shoulder and body delivered in usual fashion. Infant with spontaneous cry, placed on mother's abdomen, dried and stimulated. Cord clamped x 2 after 1-minute delay, and cut by father of baby. Cord blood drawn. Placenta delivered spontaneously with gentle cord traction. Fundus firm with massage and Pitocin. Labia, perineum, vagina, and cervix inspected and found to have bilateral labial lacerations. Her right labial laceration was deeper in the superior aspect of the tear and therefore a lower layer was sutured with 3-0 vicryl. The superficial aspect was then re-approximated with 4-0 vicryl with interrupted sutures. Her left labial tear was reapproximated with 3-0 vicryl. ? ?Placenta: intact, 3V cord, to L&D ?Complications: none ?Lacerations: bilateral labial ?EBL: 100cc ?Analgesia: epidural and local lidocaine for repair. ? ?Infant: female  APGARs 8,9  3230g ? ?Warner Mccreedy, MD, MPH ?OB Fellow, Faculty Practice ?Center for [employer], [facility] ?10/20/2021, 1:54 PM ? ? ? ? ? ?

## 2021-07-14 NOTE — Progress Notes (Signed)
Encounter opened in error

## 2021-08-01 ENCOUNTER — Other Ambulatory Visit: Payer: Self-pay

## 2021-08-01 ENCOUNTER — Ambulatory Visit (INDEPENDENT_AMBULATORY_CARE_PROVIDER_SITE_OTHER): Payer: 59

## 2021-08-01 VITALS — BP 103/63 | HR 78 | Temp 97.4°F | Wt 124.8 lb

## 2021-08-01 DIAGNOSIS — Z3480 Encounter for supervision of other normal pregnancy, unspecified trimester: Secondary | ICD-10-CM

## 2021-08-01 DIAGNOSIS — Z3A28 28 weeks gestation of pregnancy: Secondary | ICD-10-CM

## 2021-08-01 NOTE — Progress Notes (Signed)
° °  PRENATAL VISIT NOTE  Subjective:  Tammie Scott is a 21 y.o. G1P0 at [redacted]w[redacted]d being seen today for ongoing prenatal care.  She is currently monitored for the following issues for this low-risk pregnancy and has Supervision of other normal pregnancy, antepartum on their problem list.  Patient reports no complaints.  Contractions: Not present. Vag. Bleeding: None.  Movement: Present. Denies leaking of fluid.   The following portions of the patient's history were reviewed and updated as appropriate: allergies, current medications, past family history, past medical history, past social history, past surgical history and problem list.   Objective:   Vitals:   08/01/21 0812  BP: 103/63  Pulse: 78  Temp: (!) 97.4 F (36.3 C)  Weight: 124 lb 12.8 oz (56.6 kg)    Fetal Status: Fetal Heart Rate (bpm): 140 Fundal Height: 27 cm Movement: Present     General:  Alert, oriented and cooperative. Patient is in no acute distress.  Skin: Skin is warm and dry. No rash noted.   Cardiovascular: Normal heart rate noted  Respiratory: Normal respiratory effort, no problems with respiration noted  Abdomen: Soft, gravid, appropriate for gestational age.  Pain/Pressure: Absent     Pelvic: Cervical exam deferred        Extremities: Normal range of motion.  Edema: None  Mental Status: Normal mood and affect. Normal behavior. Normal judgment and thought content.   Assessment and Plan:  Pregnancy: G1P0 at [redacted]w[redacted]d 1. Supervision of other normal pregnancy, antepartum - Routine OB. Doing well, no concerns. - GTT and labs today. - Endorses active fetal movement. - Anticipatory guidance for upcoming appointments provided.    - Glucose Tolerance, 2 Hours w/1 Hour - HIV Antibody (routine testing w rflx) - RPR - CBC  Preterm labor symptoms and general obstetric precautions including but not limited to vaginal bleeding, contractions, leaking of fluid and fetal movement were reviewed in detail with the  patient. Please refer to After Visit Summary for other counseling recommendations.   Return in about 4 weeks (around 08/29/2021).  Future Appointments  Date Time Provider [place]  08/29/2021  9:35 AM Jorje Guild, NP [facility] None  09/26/2021  8:55 AM Gabriel Carina, CNM [place] None    Renee Harder, CNM

## 2021-08-05 LAB — CBC
Hematocrit: 35.9 % (ref 34.0–46.6)
Hemoglobin: 12.1 g/dL (ref 11.1–15.9)
MCH: 31 pg (ref 26.6–33.0)
MCHC: 33.7 g/dL (ref 31.5–35.7)
MCV: 92 fL (ref 79–97)
Platelets: 176 10*3/uL (ref 150–450)
RBC: 3.9 x10E6/uL (ref 3.77–5.28)
RDW: 11.7 % (ref 11.7–15.4)
WBC: 7.4 10*3/uL (ref 3.4–10.8)

## 2021-08-05 LAB — GLUCOSE TOLERANCE, 2 HOURS W/ 1HR
Glucose, 1 hour: 137 mg/dL (ref 70–179)
Glucose, 2 hour: 56 mg/dL — ABNORMAL LOW (ref 70–152)
Glucose, Fasting: 73 mg/dL (ref 70–91)

## 2021-08-05 LAB — RPR, QUANT+TP ABS (REFLEX)
Rapid Plasma Reagin, Quant: 1:2 {titer} — ABNORMAL HIGH
T Pallidum Abs: NONREACTIVE

## 2021-08-05 LAB — RPR: RPR Ser Ql: REACTIVE — AB

## 2021-08-05 LAB — HIV ANTIBODY (ROUTINE TESTING W REFLEX): HIV Screen 4th Generation wRfx: NONREACTIVE

## 2021-08-18 ENCOUNTER — Other Ambulatory Visit: Payer: Self-pay | Admitting: *Deleted

## 2021-08-18 DIAGNOSIS — Z3A30 30 weeks gestation of pregnancy: Secondary | ICD-10-CM

## 2021-08-18 DIAGNOSIS — Z3480 Encounter for supervision of other normal pregnancy, unspecified trimester: Secondary | ICD-10-CM

## 2021-08-18 DIAGNOSIS — O0930 Supervision of pregnancy with insufficient antenatal care, unspecified trimester: Secondary | ICD-10-CM

## 2021-08-18 NOTE — Progress Notes (Signed)
Patient needed a detailed anatomy ultrasound. Previous limited ultrasound completed in ED 06/2021.  Clovis Pu, RN

## 2021-08-29 ENCOUNTER — Other Ambulatory Visit: Payer: Self-pay

## 2021-08-29 ENCOUNTER — Ambulatory Visit (INDEPENDENT_AMBULATORY_CARE_PROVIDER_SITE_OTHER): Payer: 59 | Admitting: Certified Nurse Midwife

## 2021-08-29 VITALS — BP 115/80 | HR 112 | Wt 128.8 lb

## 2021-08-29 DIAGNOSIS — R000 Tachycardia, unspecified: Secondary | ICD-10-CM

## 2021-08-29 DIAGNOSIS — Z3A32 32 weeks gestation of pregnancy: Secondary | ICD-10-CM

## 2021-08-29 DIAGNOSIS — Z3403 Encounter for supervision of normal first pregnancy, third trimester: Secondary | ICD-10-CM

## 2021-08-29 DIAGNOSIS — R0602 Shortness of breath: Secondary | ICD-10-CM

## 2021-08-31 NOTE — Progress Notes (Signed)
° °  PRENATAL VISIT NOTE  Subjective:  Tammie Scott is a 21 y.o. G1P0 at [redacted]w[redacted]d being seen today for ongoing prenatal care.  She is currently monitored for the following issues for this low-risk pregnancy and has Supervision of other normal pregnancy, antepartum on their problem list.  Patient reports  some shortness of breath when laying down too flat (on her back OR on either side) or with any exertion. Also reports feeling like her heart is beating too fast when this happens. No other physical complaints .  Contractions: Not present. Vag. Bleeding: None.  Movement: Present. Denies leaking of fluid.   The following portions of the patient's history were reviewed and updated as appropriate: allergies, current medications, past family history, past medical history, past social history, past surgical history and problem list.   Objective:   Vitals:   08/29/21 0948  BP: 115/80  Pulse: (!) 112  Weight: 128 lb 12.8 oz (58.4 kg)    Fetal Status: Fetal Heart Rate (bpm): 140 Fundal Height: 30 cm Movement: Present  Presentation: Vertex  General:  Alert, oriented and cooperative. Patient is in no acute distress.  Skin: Skin is warm and dry. No rash noted.   Cardiovascular: Mildly tachycardic but regular rhythm and no murmur noted on auscultation  Respiratory: Normal respiratory effort, no problems with respiration noted  Abdomen: Soft, gravid, appropriate for gestational age.  Pain/Pressure: Absent     Pelvic: Cervical exam deferred        Extremities: Normal range of motion.  Edema: None  Mental Status: Normal mood and affect. Normal behavior. Normal judgment and thought content.   Assessment and Plan:  Pregnancy: G1P0 at [redacted]w[redacted]d 1. Supervision of low-risk first pregnancy, third trimester - Doing well overall, feeling regular and vigorous fetal movement   2. [redacted] weeks gestation of pregnancy - Routine OB care  - Reviewed 28wk labs, hemoglobin WNL  3. SOBOE (shortness of breath on  exertion) - Since SOB seems to be affecting her daily life and is new onset with no history of anemia or any other s/sx of thyroid imbalance, will send to [facility] - AMB Referral to Cardio Obstetrics  4. Tachycardia - AMB Referral to Cardio Obstetrics  Preterm labor symptoms and general obstetric precautions including but not limited to vaginal bleeding, contractions, leaking of fluid and fetal movement were reviewed in detail with the patient. Please refer to After Visit Summary for other counseling recommendations.   Return in about 2 weeks (around 09/12/2021) for IN-PERSON, LOB.  Future Appointments  Date Time Provider Department Center  09/03/2021 12:45 PM [facility] NURSE [facility]  09/03/2021  1:00 PM [facility] US1 [facility]  09/05/2021  3:40 PM Shari Prows, Kathlynn Grate, MD [facility] None  09/11/2021 11:15 AM Jerene Bears, MD [facility]    Bernerd Limbo, CNM

## 2021-09-03 ENCOUNTER — Other Ambulatory Visit: Payer: Self-pay

## 2021-09-03 ENCOUNTER — Ambulatory Visit: Payer: 59 | Attending: Obstetrics and Gynecology

## 2021-09-03 ENCOUNTER — Ambulatory Visit: Payer: 59 | Admitting: *Deleted

## 2021-09-03 ENCOUNTER — Other Ambulatory Visit: Payer: Self-pay | Admitting: *Deleted

## 2021-09-03 VITALS — BP 110/73 | HR 72

## 2021-09-03 DIAGNOSIS — O0930 Supervision of pregnancy with insufficient antenatal care, unspecified trimester: Secondary | ICD-10-CM | POA: Diagnosis not present

## 2021-09-03 DIAGNOSIS — Z3A32 32 weeks gestation of pregnancy: Secondary | ICD-10-CM | POA: Insufficient documentation

## 2021-09-03 DIAGNOSIS — Z3480 Encounter for supervision of other normal pregnancy, unspecified trimester: Secondary | ICD-10-CM

## 2021-09-03 DIAGNOSIS — Z3A30 30 weeks gestation of pregnancy: Secondary | ICD-10-CM

## 2021-09-03 DIAGNOSIS — O0933 Supervision of pregnancy with insufficient antenatal care, third trimester: Secondary | ICD-10-CM | POA: Insufficient documentation

## 2021-09-03 DIAGNOSIS — Z363 Encounter for antenatal screening for malformations: Secondary | ICD-10-CM | POA: Insufficient documentation

## 2021-09-03 DIAGNOSIS — Z362 Encounter for other antenatal screening follow-up: Secondary | ICD-10-CM

## 2021-09-04 NOTE — Progress Notes (Deleted)
?Cardio-Obstetrics Clinic ? ?New Evaluation ? ?Date:  09/05/2021  ? ?ID:  Tammie Scott, DOB 2000-07-11, MRN [id number] ? ?PCP:  [facility] ?  ?[facility] Providers ?Cardiologist:  None  ?Electrophysiologist:  None      ? ?Referring MD: Bernerd Limbo, CNM  ? ?Chief Complaint: *** ? ?History of Present Illness:   ? ?Tammie Scott is a 21 y.o. female [G1P0] who is being seen today for the evaluation of *** at the request of Bernerd Limbo, CNM.  ? ? ?Prior CV Studies Reviewed: ?The following studies were reviewed today: ?*** ? ?Past Medical History:  ?Diagnosis Date  ? COVID   ? Dysuria   ? MDD (major depressive disorder)   ? Medical history non-contributory   ? Mood changes   ? UTI (urinary tract infection)   ? Vasovagal syncope   ? Viral syndrome   ? ? ?Past Surgical History:  ?Procedure Laterality Date  ? EYE EXAMINATION UNDER ANESTHESIA Left   ? NO PAST SURGERIES    ? { ?Click here to update PMH, PSH, OB Hx then refresh note  :1}  ? ?OB History   ? ? Gravida  ?1  ? Para  ?   ? Term  ?   ? Preterm  ?   ? AB  ?   ? Living  ?   ?  ? ? SAB  ?   ? IAB  ?   ? Ectopic  ?   ? Multiple  ?   ? Live Births  ?   ?   ?  ?  ?  { ?Click here to update OB Charting then refresh note  :1}  ? ? ?Current Medications: ?No outpatient medications have been marked as taking for the 09/05/21 encounter (Appointment) with Meriam Sprague, MD.  ?  ? ?Allergies:   Patient has no known allergies.  ? ?Social History  ? ?Socioeconomic History  ? Marital status: Single  ?  Spouse name: Erby Pian  ? Number of children: 0  ? Years of education: Not on file  ? Highest education level: High school graduate  ?Occupational History  ? Occupation: Pharmacist, community Work  ?  Employer: [employer]  ?Tobacco Use  ? Smoking status: Former  ?  Types: Cigarettes  ?  Quit date: 09/2020  ?  Years since quitting: 1.0  ? Smokeless tobacco: Never  ?Vaping Use  ? Vaping Use: Former  ? Quit date: 02/10/2021  ?Substance and Sexual Activity  ? Alcohol use:  Never  ? Drug use: Not Currently  ?  Types: Marijuana  ?  Comment: Pt hasn't smoked in years  ? Sexual activity: Yes  ?Other Topics Concern  ? Not on file  ?Social History Narrative  ? Not on file  ? ?Social Determinants of Health  ? ?Financial Resource Strain: Not on file  ?Food Insecurity: Not on file  ?Transportation Needs: Not on file  ?Physical Activity: Not on file  ?Stress: Not on file  ?Social Connections: Not on file  ?{ ?Click here to update SDOH then refresh :1}  ? ? ?Family History  ?Problem Relation Age of Onset  ? Epilepsy Brother   ? Asthma Brother   ? Diabetes Maternal Grandfather   ? { ?Click here to update FH then refresh note    :1}  ? ?ROS:   ?Please see the history of present illness.    ?*** ?All other systems reviewed and are negative. ? ? ?Labs/EKG  Reviewed:   ? ?EKG:   ?EKG is *** ordered today.  The ekg ordered today demonstrates *** ? ?Recent Labs: ?06/10/2021: BUN 5; Creatinine, Ser 0.42; Potassium 3.7; Sodium 136 ?08/01/2021: Hemoglobin 12.1; Platelets 176  ? ?Recent Lipid Panel ?No results found for: CHOL, TRIG, HDL, CHOLHDL, LDLCALC, LDLDIRECT ? ?Physical Exam:   ? ?VS:  LMP 01/17/2021 (Exact Date)    ? ?Wt Readings from Last 3 Encounters:  ?08/29/21 128 lb 12.8 oz (58.4 kg)  ?08/01/21 124 lb 12.8 oz (56.6 kg)  ?06/20/21 118 lb 9.6 oz (53.8 kg)  ?  ? ?GEN: *** Well nourished, well developed in no acute distress ?HEENT: Normal ?NECK: No JVD; No carotid bruits ?LYMPHATICS: No lymphadenopathy ?CARDIAC: ***RRR, no murmurs, rubs, gallops ?RESPIRATORY:  Clear to auscultation without rales, wheezing or rhonchi  ?ABDOMEN: Soft, non-tender, non-distended ?MUSCULOSKELETAL:  No edema; No deformity  ?SKIN: Warm and dry ?NEUROLOGIC:  Alert and oriented x 3 ?PSYCHIATRIC:  Normal affect  ? ? ?Risk Assessment/Risk Calculators:   ?{ ?Click to calculate CARPREG II - THEN refresh note :1}  ?  ?{ ?Click to caclulate Mod WHO Class of CV Risk - THEN refresh note :1}  ?   ?{ ?Click for CHADS2VASc Score - THEN  Refresh Note    :[id number]}  ?  ? ? ?ASSESSMENT & PLAN:   ? ?*** ?There are no Patient Instructions on file for this visit. ? ? ?Dispo:  No follow-ups on file.  ? ?Medication Adjustments/Labs and Tests Ordered: ?Current medicines are reviewed at length with the patient today.  Concerns regarding medicines are outlined above.  ?Tests Ordered: ?No orders of the defined types were placed in this encounter. ? ?Medication Changes: ?No orders of the defined types were placed in this encounter. ?  ?

## 2021-09-05 ENCOUNTER — Ambulatory Visit: Payer: 59 | Admitting: Cardiology

## 2021-09-11 ENCOUNTER — Encounter (HOSPITAL_BASED_OUTPATIENT_CLINIC_OR_DEPARTMENT_OTHER): Payer: Self-pay | Admitting: Obstetrics & Gynecology

## 2021-09-11 ENCOUNTER — Ambulatory Visit (INDEPENDENT_AMBULATORY_CARE_PROVIDER_SITE_OTHER): Payer: 59 | Admitting: Obstetrics & Gynecology

## 2021-09-11 VITALS — BP 119/78 | HR 77 | Wt 129.2 lb

## 2021-09-11 DIAGNOSIS — Z3403 Encounter for supervision of normal first pregnancy, third trimester: Secondary | ICD-10-CM

## 2021-09-11 DIAGNOSIS — Z3480 Encounter for supervision of other normal pregnancy, unspecified trimester: Secondary | ICD-10-CM

## 2021-09-11 DIAGNOSIS — Z23 Encounter for immunization: Secondary | ICD-10-CM | POA: Diagnosis not present

## 2021-09-11 DIAGNOSIS — A530 Latent syphilis, unspecified as early or late: Secondary | ICD-10-CM

## 2021-09-11 DIAGNOSIS — Z3483 Encounter for supervision of other normal pregnancy, third trimester: Secondary | ICD-10-CM

## 2021-09-11 DIAGNOSIS — R0602 Shortness of breath: Secondary | ICD-10-CM

## 2021-09-11 DIAGNOSIS — Z3A34 34 weeks gestation of pregnancy: Secondary | ICD-10-CM

## 2021-09-14 NOTE — Progress Notes (Signed)
? ?  PRENATAL VISIT NOTE ? ?Subjective:  ?Tammie Scott is a 21 y.o. G1P0 at [redacted]w[redacted]d being seen today for ongoing prenatal care.  She is currently monitored for the following issues for this low-risk pregnancy and has Supervision of other normal pregnancy, antepartum and Generalized anxiety disorder on their problem list. ? ?Patient reports no complaints.  Had some SOB.  Cardiology referral placed.  Pt declined for now.   ? ?Contractions: Not present. Vag. Bleeding: None.  Movement: Present. Denies leaking of fluid.  ? ?The following portions of the patient's history were reviewed and updated as appropriate: allergies, current medications, past family history, past medical history, past social history, past surgical history and problem list.  ? ?Objective:  ? ?Vitals:  ? 09/11/21 1134  ?BP: 119/78  ?Pulse: 77  ?Weight: 129 lb 3.2 oz (58.6 kg)  ? ? ?Fetal Status: Fetal Heart Rate (bpm): 136 Fundal Height: 32 cm Movement: Present    ? ?General:  Alert, oriented and cooperative. Patient is in no acute distress.  ?Skin: Skin is warm and dry. No rash noted.   ?Cardiovascular: Normal heart rate noted  ?Respiratory: Normal respiratory effort, no problems with respiration noted  ?Abdomen: Soft, gravid, appropriate for gestational age.  Pain/Pressure: Absent     ?Pelvic: Cervical exam deferred        ?Extremities: Normal range of motion.  Edema: None  ?Mental Status: Normal mood and affect. Normal behavior. Normal judgment and thought content.  ? ?Assessment and Plan:  ?Pregnancy: G1P0 at [redacted]w[redacted]d ?1. [redacted] weeks gestation of pregnancy ?- on PNV ?- baby ASA recommended recommended with ultrasound 3/1.  Has not started.   ?- has follow up 4 week growth scan scheduled  ? ?2. Supervision of low-risk first pregnancy, third trimester ? ?3. Positive RPR test ?- follow testing negative (false positive) ? ?4. SOBOE (shortness of breath on exertion) ?- declined cardiology referral when called fro apt ? ?Preterm labor symptoms and general  obstetric precautions including but not limited to vaginal bleeding, contractions, leaking of fluid and fetal movement were reviewed in detail with the patient. ?Please refer to After Visit Summary for other counseling recommendations.  ? ?Return in about 2 weeks (around 09/25/2021) for GC/Chl/GBS. ? ?Future Appointments  ?Date Time Provider [place]  ?09/29/2021  4:00 PM Megan Salon, MD [facility]  ?09/30/2021 10:30 AM [facility] NURSE [facility]  ?09/30/2021 10:45 AM [facility] US5 [facility]  ?10/06/2021  1:30 PM Megan Salon, MD [facility]  ?10/22/2021  4:15 PM Megan Salon, MD [facility]  ?10/28/2021  2:00 PM Megan Salon, MD [facility]  ? ? ?Megan Salon, MD ? ?

## 2021-09-26 ENCOUNTER — Encounter: Payer: Self-pay | Admitting: Certified Nurse Midwife

## 2021-09-29 ENCOUNTER — Other Ambulatory Visit (HOSPITAL_COMMUNITY)
Admission: RE | Admit: 2021-09-29 | Discharge: 2021-09-29 | Disposition: A | Discharge status: Home / Self Care | Payer: 59 | Source: Ambulatory Visit | Attending: Obstetrics & Gynecology | Admitting: Obstetrics & Gynecology

## 2021-09-29 ENCOUNTER — Ambulatory Visit (INDEPENDENT_AMBULATORY_CARE_PROVIDER_SITE_OTHER): Payer: 59 | Admitting: Obstetrics & Gynecology

## 2021-09-29 ENCOUNTER — Other Ambulatory Visit: Payer: Self-pay

## 2021-09-29 VITALS — BP 113/70 | HR 65 | Wt 132.6 lb

## 2021-09-29 DIAGNOSIS — Z3A36 36 weeks gestation of pregnancy: Secondary | ICD-10-CM | POA: Insufficient documentation

## 2021-09-29 DIAGNOSIS — Z3480 Encounter for supervision of other normal pregnancy, unspecified trimester: Secondary | ICD-10-CM

## 2021-09-29 DIAGNOSIS — Z3403 Encounter for supervision of normal first pregnancy, third trimester: Secondary | ICD-10-CM

## 2021-09-29 DIAGNOSIS — Z3483 Encounter for supervision of other normal pregnancy, third trimester: Secondary | ICD-10-CM

## 2021-09-29 NOTE — Progress Notes (Signed)
? ?  PRENATAL VISIT NOTE ? ?Subjective:  ?Tammie Scott is a 21 y.o. G1P0 at [redacted]w[redacted]d being seen today for ongoing prenatal care.  She is currently monitored for the following issues for this low-risk pregnancy and has Supervision of other normal pregnancy, antepartum and Generalized anxiety disorder on their problem list. ? ?Patient reports no complaints.  Contractions: Not present. Vag. Bleeding: None.  Movement: Present. Denies leaking of fluid.  ? ?The following portions of the patient's history were reviewed and updated as appropriate: allergies, current medications, past family history, past medical history, past social history, past surgical history and problem list.  ? ?Objective:  ? ?Vitals:  ? 09/29/21 1627  ?BP: 113/70  ?Pulse: 65  ?Weight: 132 lb 9.6 oz (60.1 kg)  ? ? ?Fetal Status: Fetal Heart Rate (bpm): 132 Fundal Height: 35 cm Movement: Present    ? ?General:  Alert, oriented and cooperative. Patient is in no acute distress.  ?Skin: Skin is warm and dry. No rash noted.   ?Cardiovascular: Normal heart rate noted  ?Respiratory: Normal respiratory effort, no problems with respiration noted  ?Abdomen: Soft, gravid, appropriate for gestational age.  Pain/Pressure: Absent     ?Pelvic: Cervical exam deferred        ?Extremities: Normal range of motion.  Edema: None  ?Mental Status: Normal mood and affect. Normal behavior. Normal judgment and thought content.  ? ?Assessment and Plan:  ?Pregnancy: G1P0 at [redacted]w[redacted]d ?1. Supervision of low-risk first pregnancy, third trimester ?- on PNV ?- reviewed prenatal records with pt.  No pap done.  Will need to do this PP. ?- recheck 1 week ? ?2. [redacted] weeks gestation of pregnancy ?- Culture, beta strep (group b only) ?- Cervicovaginal ancillary only( [place]) ? ?Preterm labor symptoms and general obstetric precautions including but not limited to vaginal bleeding, contractions, leaking of fluid and fetal movement were reviewed in detail with the patient. ?Please refer to  After Visit Summary for other counseling recommendations.  ? ?Return in about 1 week (around 10/06/2021). ? ?Future Appointments  ?Date Time Provider [place]  ?10/06/2021  1:30 PM Megan Salon, MD [facility]  ?10/15/2021  8:45 AM Luvenia Redden, PA-C [facility]  ?10/22/2021  4:15 PM Megan Salon, MD [facility]  ?10/28/2021  2:00 PM Megan Salon, MD [facility]  ? ? ?Megan Salon, MD  ?

## 2021-09-30 ENCOUNTER — Ambulatory Visit: Payer: 59 | Admitting: *Deleted

## 2021-09-30 ENCOUNTER — Encounter: Payer: Self-pay | Admitting: *Deleted

## 2021-09-30 ENCOUNTER — Ambulatory Visit: Payer: 59 | Attending: Maternal & Fetal Medicine

## 2021-09-30 VITALS — BP 112/68 | HR 78

## 2021-09-30 DIAGNOSIS — O0933 Supervision of pregnancy with insufficient antenatal care, third trimester: Secondary | ICD-10-CM | POA: Diagnosis present

## 2021-09-30 DIAGNOSIS — Z362 Encounter for other antenatal screening follow-up: Secondary | ICD-10-CM

## 2021-09-30 DIAGNOSIS — Z3A36 36 weeks gestation of pregnancy: Secondary | ICD-10-CM | POA: Diagnosis not present

## 2021-09-30 DIAGNOSIS — Z369 Encounter for antenatal screening, unspecified: Secondary | ICD-10-CM | POA: Diagnosis not present

## 2021-09-30 DIAGNOSIS — Z3480 Encounter for supervision of other normal pregnancy, unspecified trimester: Secondary | ICD-10-CM | POA: Insufficient documentation

## 2021-10-01 ENCOUNTER — Encounter: Payer: 59 | Admitting: Certified Nurse Midwife

## 2021-10-01 LAB — CERVICOVAGINAL ANCILLARY ONLY
Chlamydia: NEGATIVE
Comment: NEGATIVE
Comment: NORMAL
Neisseria Gonorrhea: NEGATIVE

## 2021-10-04 LAB — CULTURE, BETA STREP (GROUP B ONLY): Strep Gp B Culture: NEGATIVE

## 2021-10-06 ENCOUNTER — Ambulatory Visit (INDEPENDENT_AMBULATORY_CARE_PROVIDER_SITE_OTHER): Payer: 59 | Admitting: Obstetrics & Gynecology

## 2021-10-06 VITALS — BP 118/86 | HR 79 | Wt 136.4 lb

## 2021-10-06 DIAGNOSIS — Z3483 Encounter for supervision of other normal pregnancy, third trimester: Secondary | ICD-10-CM

## 2021-10-06 DIAGNOSIS — Z3480 Encounter for supervision of other normal pregnancy, unspecified trimester: Secondary | ICD-10-CM

## 2021-10-06 DIAGNOSIS — Z3A37 37 weeks gestation of pregnancy: Secondary | ICD-10-CM

## 2021-10-06 NOTE — Progress Notes (Signed)
? ?  PRENATAL VISIT NOTE ? ?Subjective:  ?Tammie Scott is a 21 y.o. G1P0 at [redacted]w[redacted]d being seen today for ongoing prenatal care.  She is currently monitored for the following issues for this low-risk pregnancy and has Supervision of other normal pregnancy, antepartum and Generalized anxiety disorder on their problem list. ? ?Patient reports no complaints.  Contractions: Not present. Vag. Bleeding: None.  Movement: Present. Denies leaking of fluid.  ? ?The following portions of the patient's history were reviewed and updated as appropriate: allergies, current medications, past family history, past medical history, past social history, past surgical history and problem list.  ? ?Objective:  ? ?Vitals:  ? 10/06/21 1344  ?BP: 118/86  ?Pulse: 79  ?Weight: 136 lb 6.4 oz (61.9 kg)  ? ? ?Fetal Status: Fetal Heart Rate (bpm): 137 Fundal Height: 35 cm Movement: Present  Presentation: Vertex ? ?General:  Alert, oriented and cooperative. Patient is in no acute distress.  ?Skin: Skin is warm and dry. No rash noted.   ?Cardiovascular: Normal heart rate noted  ?Respiratory: Normal respiratory effort, no problems with respiration noted  ?Abdomen: Soft, gravid, appropriate for gestational age.  Pain/Pressure: Present     ?Pelvic: Cervical exam performed in the presence of a chaperone Dilation: 2.5 Effacement (%): 70 Station: -2  ?Extremities: Normal range of motion.  Edema: None  ?Mental Status: Normal mood and affect. Normal behavior. Normal judgment and thought content.  ? ?Assessment and Plan:  ?Pregnancy: G1P0 at [redacted]w[redacted]d ?1. [redacted] weeks gestation of pregnancy ?- on PNV ?- recheck 1 week ?- advised to consider not traveling or at least know where hospitals area along the way or to not wait to return to [place] if starts with regular contractions ?- also recommended checking BP twice weekly at this poitn ? ?2. Supervision of other normal pregnancy, antepartum ? ?Preterm labor symptoms and general obstetric precautions including but not  limited to vaginal bleeding, contractions, leaking of fluid and fetal movement were reviewed in detail with the patient. ?Please refer to After Visit Summary for other counseling recommendations.  ? ?Return in about 1 week (around 10/13/2021). ? ?Future Appointments  ?Date Time Provider Department Center  ?10/15/2021  8:45 AM Marny Lowenstein, PA-C [facility]  ?10/22/2021  4:15 PM Jerene Bears, MD [facility]  ?10/28/2021  2:00 PM Jerene Bears, MD [facility]  ? ? ?Jerene Bears, MD  ?

## 2021-10-09 ENCOUNTER — Encounter: Payer: 59 | Admitting: Obstetrics and Gynecology

## 2021-10-15 ENCOUNTER — Encounter (HOSPITAL_BASED_OUTPATIENT_CLINIC_OR_DEPARTMENT_OTHER): Payer: Self-pay | Admitting: Medical

## 2021-10-15 ENCOUNTER — Ambulatory Visit (INDEPENDENT_AMBULATORY_CARE_PROVIDER_SITE_OTHER): Payer: 59 | Admitting: Medical

## 2021-10-15 ENCOUNTER — Encounter: Payer: 59 | Admitting: Certified Nurse Midwife

## 2021-10-15 VITALS — BP 117/76 | HR 67 | Wt 137.6 lb

## 2021-10-15 DIAGNOSIS — Z3483 Encounter for supervision of other normal pregnancy, third trimester: Secondary | ICD-10-CM

## 2021-10-15 DIAGNOSIS — Z3A38 38 weeks gestation of pregnancy: Secondary | ICD-10-CM

## 2021-10-15 DIAGNOSIS — Z3480 Encounter for supervision of other normal pregnancy, unspecified trimester: Secondary | ICD-10-CM

## 2021-10-15 DIAGNOSIS — F411 Generalized anxiety disorder: Secondary | ICD-10-CM

## 2021-10-15 NOTE — Patient Instructions (Addendum)
Safe Medications in Pregnancy  ? ?Acne:  ?Benzoyl Peroxide  ?Salicylic Acid  ? ?Backache/Headache:  ?Tylenol: 2 regular strength every 4 hours OR  ?             2 Extra strength every 6 hours  ? ?Colds/Coughs/Allergies:  ?Benadryl (alcohol free) 25 mg every 6 hours as needed  ?Breath right strips  ?Claritin  ?Cepacol throat lozenges  ?Chloraseptic throat spray  ?Cold-Eeze- up to three times per day  ?Cough drops, alcohol free  ?Flonase (by prescription only)  ?Guaifenesin  ?Mucinex  ?Robitussin DM (plain only, alcohol free)  ?Saline nasal spray/drops  ?Sudafed (pseudoephedrine) & Actifed * use only after [redacted] weeks gestation and if you do not have high blood pressure  ?Tylenol  ?Vicks Vaporub  ?Zinc lozenges  ?Zyrtec  ? ?Constipation:  ?Colace  ?Ducolax suppositories  ?Fleet enema  ?Glycerin suppositories  ?Metamucil  ?Milk of magnesia  ?Miralax  ?Senokot  ?Smooth move tea  ? ?Diarrhea:  ?Kaopectate  ?Imodium A-D  ? ?*NO pepto Bismol  ? ?Hemorrhoids:  ?Anusol  ?Anusol HC  ?Preparation H  ?Tucks  ? ?Indigestion:  ?Tums  ?Maalox  ?Mylanta  ?Zantac  ?Pepcid  ? ?Insomnia:  ?Benadryl (alcohol free) 25mg every 6 hours as needed  ?Tylenol PM  ?Unisom, no Gelcaps  ? ?Leg Cramps:  ?Tums  ?MagGel  ? ?Nausea/Vomiting:  ?Bonine  ?Dramamine  ?Emetrol  ?Ginger extract  ?Sea bands  ?Meclizine  ?Nausea medication to take during pregnancy:  ?Unisom (doxylamine succinate 25 mg tablets) Take one tablet daily at bedtime. If symptoms are not adequately controlled, the dose can be increased to a maximum recommended dose of two tablets daily (1/2 tablet in the morning, 1/2 tablet mid-afternoon and one at bedtime).  ?Vitamin B6 100mg tablets. Take one tablet twice a day (up to 200 mg per day).  ? ?Skin Rashes:  ?Aveeno products  ?Benadryl cream or 25mg every 6 hours as needed  ?Calamine Lotion  ?1% cortisone cream  ? ?Yeast infection:  ?Gyne-lotrimin 7  ?Monistat 7  ? ? ?**If taking multiple medications, please check labels to avoid  duplicating the same active ingredients  ?**take medication as directed on the label  ?** Do not exceed 4000 mg of tylenol in 24 hours  ?**Do not take medications that contain aspirin or ibuprofen  ?    ?   ? ?AREA PEDIATRIC/FAMILY PRACTICE PHYSICIANS ? ?[place] ([address]) ?[facility] ?Chambliss, MD; Eniola, MD; Hale, MD; Hensel, MD; McDiarmid, MD; McIntyer, MD; Neal, MD; Walden, MD ?[address] [place], [place] [address] ?[phone] ?Mon-Fri 8:30-12:30, 1:30-5:00 ?Providers come to see babies at [facility] ?Accepting Medicaid ?[facility] ?Limited providers who accept newborns: Koirala, MD; Morrow, MD; Wolters, MD ?[address], [place], [place] [address] ?[phone] ?Mon-Fri 8:00-5:30 ?Babies seen by providers at [facility] ?Does NOT accept Medicaid ?Please call early in hospitalization for appointment (limited availability)  ?[facility] ?Mulberry, MD ?[address] [place], [place] [address] ?[phone] ?Mon, Tue, Thur, Fri 8:30-5:00, Wed 10:00-7:00 (closed 1-2pm) ?Babies seen by [facility] providers ?Accepting Medicaid ?[facility] ?Rubin, MD ?[address], [place], [place] [address] ?[phone] ?Mon-Fri 8:30-5:00, Sat 8:30-12:00 ?Provider comes to see babies at [facility] ?Accepting Medicaid ?Must have been referred from current patients or contacted office prior to delivery ?[facility] ([facility]) ?Brown, MD; Chandler, MD; Ettefagh,   MD; Grant, MD; Lester, MD; McCormick, MD; McQueen, MD; Prose, MD; Simha, MD; Stanley, MD; Stryffeler, NP; Tebben, NP ?[address], [place], [place] [address] ?[phone] ?Mon, Tue, Thur, Fri 8:30-5:30, Wed 9:30-5:30, Sat 8:30-12:30 ?Babies seen by [facility] providers ?Accepting Medicaid ?Only accepting infants of first-time parents or  siblings of current patients ?Hospital discharge coordinator will make follow-up appointment ?Jack Amos ?[address], [place], [place]  [address] ?[phone]   Fax - [phone] ?[facility] ?[address], [place], [place]  [address] ?Phone - [phone]   Fax - [phone] ?Shilpa Gosrani ?[address], [place], [place]  [address] ?[phone] ? ?[place] ([address]) ?[facility] ?Bates, MD; [place]assfield, MD; Cooper, Cox, MD; MD; Davis, MD; Dovico, MD; Ettefaugh, MD; Little, MD; Lowe, MD; Keiffer, MD; Melvin, MD; Sumner, MD; Williams, MD ?[address], [place], [place] [address] ?[phone] ?Mon-Fri 8:30-5:00 (extended evenings Mon-Thur as needed), Sat-Sun 10:00-1:00 ?Providers come to see babies at [facility] ?Accepting Medicaid for families of first-time babies and families with all children in the household age 3 and under. Must register with office prior to making appointment (M-F only). ?[facility] ?Henson, NP; Knapp, MD; Lalonde, MD; Tysinger, PA ?[address] [place], [place] [address] ?[phone] ?Mon-Fri 8:00-5:00 ?Babies seen by providers at [facility] ?Does NOT accept Medicaid/Commercial Insurance Only ?[facility] ([facility])  ?Artis, MD; Barnes, MD; Bratton, MD; Coccaro, MD; Lockett Gardner, MD; Kramer, MD; Marshall, MD; Netherton, MD; Poleto, MD; Skinner, MD ?[address] [place], [place] [address] ?[phone] ?Mon-Fri 8:30-5:30, Sat (Oct.-Mar.) 9:00-1:00 ?Babies seen by providers at [facility] ?Accepting Medicaid ? ?[place] ([address]) ?[facility] ?Reid, MD; Warner, MD ?[address], [place], [place] [address] ?[phone] ?Mon-Fri 8:30-5:00, Sat 8:30-12:00 ?Providers come to see babies at [facility] ?Does NOT accept Medicaid ?[facility] ?Becker, PA; Hagler, MD; Scifres, PA; Sun,  MD; Swayne, MD ?[address], [place], [place] [address] ?[phone] ?Mon-Fri 8:00-5:00 ?Babies seen by providers at [facility] ?Does NOT accept Medicaid ?Only accepting babies of parents who are patients ?Please call early in hospitalization for appointment (limited availability) ?[facility] ?Clark, MD; Frye, MD; Kelleher, MD; Mack, NP; Miller, MD; O'Keller, MD; Patterson, NP; Pudlo, MD; Puzio, MD; Thomas, MD; Tucker, MD; Twiselton, MD ?[address], [place], [place] [address] ?[phone] ?Mon-Fri 8:00-5:00, Sat 9:00-12:00 ?Providers come to see babies at [facility] ?Does NOT accept Medicaid ? ?[place] ([address]) ?[facility] ?Limited providers accepting new patients: Brake, NP; Wharton, PA ?[address], [place], [place] [address] ?[phone] ?Mon-Fri 8:00-5:00 ?Babies seen by providers at [facility] ?Does NOT accept Medicaid ?Only accepting babies of parents who are patients ?Please call early in hospitalization for appointment (limited availability) ?[facility] ?Gay, MD; Quinlan, MD ?[address] [place], [place] [address] ?[phone] (press 1 to schedule appointment) ?Mon-Fri 8:00-5:00 ?Providers come to see babies at [facility] ?Does NOT accept Medicaid ?[facility] ?Mazer, MD ?[address] [place], [place] [address] ?[phone] ?Mon-Fri 8:30-5:00 (lunch 12:30-1:00), extended hours by appointment only Wed 5:00-6:30 ?Babies seen by [facility] providers ?Accepting Medicaid ?[facility] ?Banks, MD; Jordan, MD; Koberlein, MD ?[address], [place], [place] [address] ?[phone] ?Mon-Fri 8:00-5:00 ?Babies seen by [facility] providers ?Does NOT accept Medicaid ?[facility] ?Parker, MD; Hunter, MD; Wallace, DO ?[address]   [place], [place] [address] ?[phone] ?Mon-Fri 8:00-5:00 ?Babies seen by [facility] providers ?Does NOT accept Medicaid ?[facility] ?Brandon, PA; Brecken, PA; Christy, NP; Dees, MD; DeClaire, MD; DeWeese, MD; Hansen, NP; Mills, NP; Parrish, NP; Smoot, NP; Summer, MD; Vapne, M

## 2021-10-15 NOTE — Progress Notes (Signed)
? ?  PRENATAL VISIT NOTE ? ?Subjective:  ?Tammie Scott is a 21 y.o. G1P0 at [redacted]w[redacted]d being seen today for ongoing prenatal care.  She is currently monitored for the following issues for this low-risk pregnancy and has Supervision of other normal pregnancy, antepartum and Generalized anxiety disorder on their problem list. ? ?Patient reports occasional contractions.  Contractions: Irregular. Vag. Bleeding: None.  Movement: Present. Denies leaking of fluid.  ? ?The following portions of the patient's history were reviewed and updated as appropriate: allergies, current medications, past family history, past medical history, past social history, past surgical history and problem list.  ? ?Objective:  ? ?Vitals:  ? 10/15/21 0853  ?BP: 117/76  ?Pulse: 67  ?Weight: 137 lb 9.6 oz (62.4 kg)  ? ? ?Fetal Status: Fetal Heart Rate (bpm): 126   Movement: Present    ? ?General:  Alert, oriented and cooperative. Patient is in no acute distress.  ?Skin: Skin is warm and dry. No rash noted.   ?Cardiovascular: Normal heart rate noted  ?Respiratory: Normal respiratory effort, no problems with respiration noted  ?Abdomen: Soft, gravid, appropriate for gestational age.  Pain/Pressure: Present     ?Pelvic: Cervical exam performed in the presence of a chaperone       2.5/80/-2  ?Extremities: Normal range of motion.  Edema: None  ?Mental Status: Normal mood and affect. Normal behavior. Normal judgment and thought content.  ? ?Assessment and Plan:  ?Pregnancy: G1P0 at [redacted]w[redacted]d ?1. Supervision of other normal pregnancy, antepartum ?- Peds list given and importance of peds discussed  ?- Planning IUD ?- GBS negative reviewed ?- last [place] normal on 3/28 reviewed ? ?2. Generalized anxiety disorder ? ?3. [redacted] weeks gestation of pregnancy ? ? ?Term labor symptoms and general obstetric precautions including but not limited to vaginal bleeding, contractions, leaking of fluid and fetal movement were reviewed in detail with the patient. ?Please refer to After  Visit Summary for other counseling recommendations.  ? ?Return in about 1 week (around 10/22/2021) for LOB, any provider, In-Person. ? ?Future Appointments  ?Date Time Provider Department Center  ?10/22/2021  4:15 PM Jerene Bears, MD [facility]  ?10/28/2021  2:00 PM Jerene Bears, MD [facility]  ? ? ?Vonzella Nipple, PA-C ? ?

## 2021-10-19 ENCOUNTER — Inpatient Hospital Stay (HOSPITAL_COMMUNITY)
Admission: AD | Admit: 2021-10-19 | Discharge: 2021-10-22 | DRG: 807 | Disposition: A | Discharge status: Home / Self Care | Payer: 59 | Attending: Family Medicine | Admitting: Family Medicine

## 2021-10-19 ENCOUNTER — Other Ambulatory Visit: Payer: Self-pay

## 2021-10-19 DIAGNOSIS — F411 Generalized anxiety disorder: Secondary | ICD-10-CM | POA: Diagnosis present

## 2021-10-19 DIAGNOSIS — Z87891 Personal history of nicotine dependence: Secondary | ICD-10-CM | POA: Diagnosis not present

## 2021-10-19 DIAGNOSIS — Z3A39 39 weeks gestation of pregnancy: Secondary | ICD-10-CM | POA: Diagnosis not present

## 2021-10-19 DIAGNOSIS — Z3480 Encounter for supervision of other normal pregnancy, unspecified trimester: Secondary | ICD-10-CM

## 2021-10-19 DIAGNOSIS — O26893 Other specified pregnancy related conditions, third trimester: Secondary | ICD-10-CM | POA: Diagnosis present

## 2021-10-19 DIAGNOSIS — Z3483 Encounter for supervision of other normal pregnancy, third trimester: Secondary | ICD-10-CM | POA: Diagnosis not present

## 2021-10-19 LAB — AMNISURE RUPTURE OF MEMBRANE (ROM) NOT AT ARMC: Amnisure ROM: NEGATIVE

## 2021-10-19 MED ORDER — LACTATED RINGERS IV SOLN: INTRAVENOUS | Status: DC

## 2021-10-19 NOTE — MAU Note (Signed)
Pt presents to MAU with c/o ctx that began this morning - unsure of time. Pt reports ctx q5 mins. She reports watery discharge that began about an hour ago. Denies VB. +FM. Pain - 5/10. ? ?

## 2021-10-19 NOTE — MAU Provider Note (Signed)
S: Ms. Braelynne Quam is a 21 y.o. G1P0 at [redacted]w[redacted]d  who presents to MAU today complaining of leaking of fluid since 0830. She denies vaginal bleeding. She endorses contractions. She reports normal fetal movement.   ? ?O: BP 124/74   Pulse 85   Temp 98.1 ?F (36.7 ?C) (Oral)   Resp 20   Wt 62.8 kg   LMP 01/17/2021 (Exact Date)   SpO2 98%   BMI 25.31 kg/m?  ?GENERAL: Well-developed, well-nourished female in no acute distress.  ?HEAD: Normocephalic, atraumatic.  ?CHEST: Normal effort of breathing, regular heart rate ?ABDOMEN: Soft, nontender, gravid ?PELVIC: deferred, amnisure collected ? ?Cervical exam:  ?Dilation: 3 ?Effacement (%): 80 ?Station: 0 ?Presentation: Vertex ?Exam by:: Earnest Conroy RN ? ? ?Fetal Monitoring: ?Baseline: 120 ?Variability: moderate ?Accelerations: 15x15 ?Decelerations: none ?Contractions: 4-5 ? ?Results for orders placed or performed during the hospital encounter of 10/19/21 (from the past 24 hour(s))  ?Amnisure rupture of membrane (rom)not at [facility]     Status: None  ? Collection Time: 10/19/21 10:33 PM  ?Result Value Ref Range  ? Amnisure ROM NEGATIVE   ?CBC     Status: Abnormal  ? Collection Time: 10/19/21 11:29 PM  ?Result Value Ref Range  ? WBC 9.9 4.0 - 10.5 K/uL  ? RBC 4.74 3.87 - 5.11 MIL/uL  ? Hemoglobin 12.4 12.0 - 15.0 g/dL  ? HCT 39.2 36.0 - 46.0 %  ? MCV 82.7 80.0 - 100.0 fL  ? MCH 26.2 26.0 - 34.0 pg  ? MCHC 31.6 30.0 - 36.0 g/dL  ? RDW 14.2 11.5 - 15.5 %  ? Platelets 142 (L) 150 - 400 K/uL  ? nRBC 0.0 0.0 - 0.2 %  ?Type and screen [place]     Status: None  ? Collection Time: 10/19/21 11:29 PM  ?Result Value Ref Range  ? ABO/RH(D) A POS   ? Antibody Screen NEG   ? Sample Expiration    ?  10/22/2021,2359 ?Performed at [facility], [place] [address], [place], [place] [address] ?  ? ?Dilation: 4.5 ?Effacement (%): 100 ?Station: 0 ?Presentation: Vertex ?Exam by:: Len Blalock, CNM ? ? ?Membranes intact but patient has made cervical change and contracting  painfully. Will admit to labor and delivery ? ?A: ?SIUP at [redacted]w[redacted]d  ?Membranes intact ?Spontaneous onset of labor ? ?P: ?Admit to labor and delivery ?Report called to K. Kooistra CNM ? ?Wende Mott, CNM ?10/19/2021 10:15 PM ? ?

## 2021-10-20 ENCOUNTER — Inpatient Hospital Stay (HOSPITAL_COMMUNITY): Payer: 59 | Admitting: Anesthesiology

## 2021-10-20 ENCOUNTER — Encounter (HOSPITAL_COMMUNITY): Payer: Self-pay | Admitting: Student

## 2021-10-20 DIAGNOSIS — Z3483 Encounter for supervision of other normal pregnancy, third trimester: Secondary | ICD-10-CM | POA: Diagnosis not present

## 2021-10-20 DIAGNOSIS — Z3A39 39 weeks gestation of pregnancy: Secondary | ICD-10-CM | POA: Diagnosis not present

## 2021-10-20 LAB — TYPE AND SCREEN
ABO/RH(D): A POS
Antibody Screen: NEGATIVE

## 2021-10-20 LAB — CBC
HCT: 39.2 % (ref 36.0–46.0)
Hemoglobin: 12.4 g/dL (ref 12.0–15.0)
MCH: 26.2 pg (ref 26.0–34.0)
MCHC: 31.6 g/dL (ref 30.0–36.0)
MCV: 82.7 fL (ref 80.0–100.0)
Platelets: 142 10*3/uL — ABNORMAL LOW (ref 150–400)
RBC: 4.74 MIL/uL (ref 3.87–5.11)
RDW: 14.2 % (ref 11.5–15.5)
WBC: 9.9 10*3/uL (ref 4.0–10.5)
nRBC: 0 % (ref 0.0–0.2)

## 2021-10-20 LAB — POCT FERN TEST: POCT Fern Test: NEGATIVE

## 2021-10-20 LAB — RPR
RPR Ser Ql: REACTIVE — AB
RPR Titer: 1:8 {titer}

## 2021-10-20 MED ORDER — SOD CITRATE-CITRIC ACID 500-334 MG/5ML PO SOLN: 30.0000 mL | ORAL | Status: DC | PRN

## 2021-10-20 MED ORDER — SENNOSIDES-DOCUSATE SODIUM 8.6-50 MG PO TABS
2.0000 | ORAL_TABLET | Freq: Every day | ORAL | Status: DC
  Administered 2021-10-21 – 2021-10-22 (×2): 2 via ORAL
  Filled 2021-10-20 (×2): qty 2

## 2021-10-20 MED ORDER — PHENYLEPHRINE 40 MCG/ML (10ML) SYRINGE FOR IV PUSH (FOR BLOOD PRESSURE SUPPORT): 80.0000 ug | PREFILLED_SYRINGE | INTRAVENOUS | Status: DC | PRN

## 2021-10-20 MED ORDER — TERBUTALINE SULFATE 1 MG/ML IJ SOLN: 0.2500 mg | Freq: Once | INTRAMUSCULAR | Status: DC | PRN

## 2021-10-20 MED ORDER — ONDANSETRON HCL 4 MG PO TABS
4.0000 mg | ORAL_TABLET | ORAL | Status: DC | PRN
Start: 2021-10-20 — End: 2021-10-22

## 2021-10-20 MED ORDER — WITCH HAZEL-GLYCERIN EX PADS: 1.0000 "application " | MEDICATED_PAD | CUTANEOUS | Status: DC | PRN

## 2021-10-20 MED ORDER — LACTATED RINGERS IV SOLN: 500.0000 mL | Freq: Once | INTRAVENOUS | Status: DC

## 2021-10-20 MED ORDER — PRENATAL MULTIVITAMIN CH
1.0000 | ORAL_TABLET | Freq: Every day | ORAL | Status: DC
  Administered 2021-10-21 – 2021-10-22 (×2): 1 via ORAL
  Filled 2021-10-20 (×2): qty 1

## 2021-10-20 MED ORDER — DIPHENHYDRAMINE HCL 25 MG PO CAPS: 25.0000 mg | ORAL_CAPSULE | Freq: Four times a day (QID) | ORAL | Status: DC | PRN

## 2021-10-20 MED ORDER — BENZOCAINE-MENTHOL 20-0.5 % EX AERO
1.0000 "application " | INHALATION_SPRAY | CUTANEOUS | Status: DC | PRN
  Administered 2021-10-20: 1 via TOPICAL
  Filled 2021-10-20: qty 56

## 2021-10-20 MED ORDER — DIPHENHYDRAMINE HCL 50 MG/ML IJ SOLN: 12.5000 mg | INTRAMUSCULAR | Status: DC | PRN

## 2021-10-20 MED ORDER — OXYTOCIN-SODIUM CHLORIDE 30-0.9 UT/500ML-% IV SOLN
1.0000 m[IU]/min | INTRAVENOUS | Status: DC
  Administered 2021-10-20: 2 m[IU]/min via INTRAVENOUS
  Filled 2021-10-20: qty 500

## 2021-10-20 MED ORDER — ACETAMINOPHEN 325 MG PO TABS
650.0000 mg | ORAL_TABLET | ORAL | Status: DC | PRN
Start: 2021-10-20 — End: 2021-10-22
  Administered 2021-10-21: 650 mg via ORAL
  Filled 2021-10-20: qty 2

## 2021-10-20 MED ORDER — LIDOCAINE HCL (PF) 1 % IJ SOLN
30.0000 mL | INTRAMUSCULAR | Status: AC | PRN
Start: 2021-10-20 — End: 2021-10-22
  Administered 2021-10-20: 30 mL via SUBCUTANEOUS
  Filled 2021-10-20: qty 30

## 2021-10-20 MED ORDER — EPHEDRINE 5 MG/ML INJ: 10.0000 mg | INTRAVENOUS | Status: DC | PRN

## 2021-10-20 MED ORDER — ONDANSETRON HCL 4 MG/2ML IJ SOLN: 4.0000 mg | INTRAMUSCULAR | Status: DC | PRN

## 2021-10-20 MED ORDER — OXYTOCIN BOLUS FROM INFUSION
333.0000 mL | Freq: Once | INTRAVENOUS | Status: AC
  Administered 2021-10-20: 333 mL via INTRAVENOUS

## 2021-10-20 MED ORDER — ONDANSETRON HCL 4 MG/2ML IJ SOLN: 4.0000 mg | Freq: Four times a day (QID) | INTRAMUSCULAR | Status: DC | PRN

## 2021-10-20 MED ORDER — MEASLES, MUMPS & RUBELLA VAC IJ SOLR
0.5000 mL | Freq: Once | INTRAMUSCULAR | Status: DC
Start: 2021-10-21 — End: 2021-10-22

## 2021-10-20 MED ORDER — MEDROXYPROGESTERONE ACETATE 150 MG/ML IM SUSP: 150.0000 mg | INTRAMUSCULAR | Status: DC | PRN

## 2021-10-20 MED ORDER — FENTANYL CITRATE (PF) 100 MCG/2ML IJ SOLN
100.0000 ug | INTRAMUSCULAR | Status: DC | PRN
  Administered 2021-10-20: 100 ug via INTRAVENOUS
  Filled 2021-10-20: qty 2

## 2021-10-20 MED ORDER — COCONUT OIL OIL
1.0000 "application " | TOPICAL_OIL | Status: DC | PRN
  Administered 2021-10-21: 1 via TOPICAL

## 2021-10-20 MED ORDER — OXYCODONE-ACETAMINOPHEN 5-325 MG PO TABS: 1.0000 | ORAL_TABLET | ORAL | Status: DC | PRN

## 2021-10-20 MED ORDER — LACTATED RINGERS IV SOLN: 500.0000 mL | INTRAVENOUS | Status: DC | PRN

## 2021-10-20 MED ORDER — IBUPROFEN 600 MG PO TABS
600.0000 mg | ORAL_TABLET | Freq: Four times a day (QID) | ORAL | Status: DC
  Administered 2021-10-20 – 2021-10-22 (×7): 600 mg via ORAL
  Filled 2021-10-20 (×8): qty 1

## 2021-10-20 MED ORDER — OXYCODONE-ACETAMINOPHEN 5-325 MG PO TABS: 2.0000 | ORAL_TABLET | ORAL | Status: DC | PRN

## 2021-10-20 MED ORDER — OXYTOCIN-SODIUM CHLORIDE 30-0.9 UT/500ML-% IV SOLN
2.5000 [IU]/h | INTRAVENOUS | Status: DC
Start: 2021-10-20 — End: 2021-10-20
  Administered 2021-10-20: 2.5 [IU]/h via INTRAVENOUS

## 2021-10-20 MED ORDER — PHENYLEPHRINE 40 MCG/ML (10ML) SYRINGE FOR IV PUSH (FOR BLOOD PRESSURE SUPPORT)
80.0000 ug | PREFILLED_SYRINGE | INTRAVENOUS | Status: DC | PRN
  Filled 2021-10-20: qty 10

## 2021-10-20 MED ORDER — FENTANYL-BUPIVACAINE-NACL 0.5-0.125-0.9 MG/250ML-% EP SOLN
12.0000 mL/h | EPIDURAL | Status: DC | PRN
  Administered 2021-10-20: 12 mL/h via EPIDURAL
  Filled 2021-10-20: qty 250

## 2021-10-20 MED ORDER — LIDOCAINE HCL (PF) 1 % IJ SOLN
INTRAMUSCULAR | Status: DC | PRN
  Administered 2021-10-20: 2 mL via EPIDURAL
  Administered 2021-10-20: 5 mL via EPIDURAL
  Administered 2021-10-20: 3 mL via EPIDURAL

## 2021-10-20 MED ORDER — TETANUS-DIPHTH-ACELL PERTUSSIS 5-2.5-18.5 LF-MCG/0.5 IM SUSY
0.5000 mL | PREFILLED_SYRINGE | Freq: Once | INTRAMUSCULAR | Status: DC
Start: 2021-10-21 — End: 2021-10-22

## 2021-10-20 MED ORDER — SIMETHICONE 80 MG PO CHEW: 80.0000 mg | CHEWABLE_TABLET | ORAL | Status: DC | PRN

## 2021-10-20 MED ORDER — DIBUCAINE (PERIANAL) 1 % EX OINT: 1.0000 "application " | TOPICAL_OINTMENT | CUTANEOUS | Status: DC | PRN

## 2021-10-20 MED ORDER — ACETAMINOPHEN 325 MG PO TABS: 650.0000 mg | ORAL_TABLET | ORAL | Status: DC | PRN

## 2021-10-20 NOTE — Progress Notes (Signed)
Tammie Scott is a 21 y.o. G1P0 at [redacted]w[redacted]d  admitted for spontaneous onset of labor and now being augmented ? ?Subjective: ?Reports starting to feel uncomfortable. Still thinks she would like to hold off on pain medications at this time. ? ?Objective: ?BP 128/88   Pulse 76   Temp 98 ?F (36.7 ?C) (Oral)   Resp 18   Ht 5\' 2"  (1.575 m)   Wt 62.6 kg   LMP 01/17/2021 (Exact Date)   SpO2 99%   BMI 25.24 kg/m?  ?No intake/output data recorded. ?No intake/output data recorded. ? ?FHT:  FHR: 120 bpm, variability: moderate,  accelerations:  Present,  decelerations:  Absent ?UC:   regular, every 2-3 minutes ?SVE:   Dilation: 5 ?Effacement (%): 100 ?Station: 0 ?Exam by:: Tammie Scott ? ?Labs: ?Lab Results  ?Component Value Date  ? WBC 9.9 10/19/2021  ? HGB 12.4 10/19/2021  ? HCT 39.2 10/19/2021  ? MCV 82.7 10/19/2021  ? PLT 142 (L) 10/19/2021  ? ? ?Assessment / Plan: ?G1P0 at [redacted]w[redacted]d  admitted for spontaneous onset of labor and now being augmented ? ?Labor:  cervix unchanged. Continue uptitrating pitocin. Consider AROM at next check is not ruptured ? ?Fetal Wellbeing:  Category I ?Pain Control:  Labor support without medications ?I/D:   negative ? ? ?Tammie Scott ?10/20/2021, 10:07 AM ? ? ?

## 2021-10-20 NOTE — Lactation Note (Signed)
This note was copied from a baby's chart. ?Lactation Consultation Note ? ?Patient Name: Tammie Scott ?Today's Date: 10/20/2021 ?Reason for consult: Follow-up assessment;1st time breastfeeding;Term ?Age:22 hours ?P1, term female infant. ?Per mom, she feels breastfeeding is going well, infant BF for 15 minutes in L&D, recently BF for 20 minutes on MBU. ?LC did not observe latch due infant recently BF prior to [facility] entering the room. ?Mom will continue to BF infant on demand or 8 to 12+ times within 24 hours, STS. ?Mom will attempt to latch infant on both breast during a feeding. ?Mom knows to call [place] if she needs further assistance with latching infant at the breast. ?Mom made aware of O/P services, breastfeeding support groups, community resources, and our phone # for post-discharge questions.   ?Maternal Data ?Has patient been taught Hand Expression?: Yes ?Does the patient have breastfeeding experience prior to this delivery?: No ? ?Feeding ?Mother's Current Feeding Choice: Breast Milk ? ?LATCH Score ?Lactation Tools Discussed/Used ?  ? ?Interventions ?Interventions: Breast feeding basics reviewed;Skin to skin;Breast compression;Position options;Education;Hand express;LC Services brochure ? ?Discharge ?  ? ?Consult Status ?Consult Status: Follow-up ?Date: 10/21/21 ?Follow-up type: In-patient ? ? ? ?Tammie Scott ?10/20/2021, 4:56 PM ? ? ? ?

## 2021-10-20 NOTE — H&P (Signed)
? ?Tammie Scott is a 21 y.o. female G1P0 with IUP at [redacted]w[redacted]d by LMP presenting for labor.  ?She reports positive fetal movement. She denies leakage of fluid or vaginal bleeding. 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  ? ?Prenatal History/Complications: ?PNC at [facility] then transferred to [facility].  ?Pregnancy complications:  ?- Past Medical History: ?Past Medical History:  ?Diagnosis Date  ? COVID   ? Dysuria   ? MDD (major depressive disorder)   ? Medical history non-contributory   ? Mood changes   ? UTI (urinary tract infection)   ? Vasovagal syncope   ? Viral syndrome   ? ? ?Past Surgical History: ?Past Surgical History:  ?Procedure Laterality Date  ? EYE EXAMINATION UNDER ANESTHESIA Left   ? NO PAST SURGERIES    ? ? ?Obstetrical History: ?OB History   ? ? Gravida  ?1  ? Para  ?   ? Term  ?   ? Preterm  ?   ? AB  ?   ? Living  ?   ?  ? ? SAB  ?   ? IAB  ?   ? Ectopic  ?   ? Multiple  ?   ? Live Births  ?   ?   ?  ?  ? ? ? ?Social History: ?Social History  ? ?Socioeconomic History  ? Marital status: Single  ?  Spouse name: Bonney Roussel  ? Number of children: 0  ? Years of education: Not on file  ? Highest education level: High school graduate  ?Occupational History  ? Occupation: Academic librarian Work  ?  Employer: [employer]  ?Tobacco Use  ? Smoking status: Former  ?  Types: Cigarettes  ?  Quit date: 09/2020  ?  Years since quitting: 1.1  ? Smokeless tobacco: Never  ?Vaping Use  ? Vaping Use: Former  ? Quit date: 02/10/2021  ?Substance and Sexual Activity  ? Alcohol use: Never  ? Drug use: Not Currently  ?  Types: Marijuana  ?  Comment: Pt hasn't smoked in years  ? Sexual activity: Yes  ?Other Topics Concern  ? Not on file  ?Social History Narrative  ? Not on file  ? ?Social Determinants of Health  ? ?Financial Resource Strain: Not on file  ?Food Insecurity: Not on file  ?Transportation Needs: Not on file  ?Physical Activity: Not on file  ?Stress: Not on  file  ?Social Connections: Not on file  ? ? ?Family History: ?Family History  ?Problem Relation Age of Onset  ? Epilepsy Brother   ? Asthma Brother   ? Diabetes Maternal Grandfather   ? ? ?Allergies: ?No Known Allergies ? ?Medications Prior to Admission  ?Medication Sig Dispense Refill Last Dose  ? Prenatal Vit-Fe Fumarate-FA (PREPLUS) 27-1 MG TABS Take 1 tablet by mouth daily. 30 tablet 12 10/19/2021  ? ? ?Review of Systems  ? ?Constitutional: Negative for fever and chills ?Eyes: Negative for visual disturbances ?Respiratory: Negative for shortness of breath, dyspnea ?Cardiovascular: Negative for chest pain or palpitations  ?Gastrointestinal: Negative for vomiting, diarrhea and constipation.  POSITIVE for abdominal pain (contractions) ?Genitourinary: Negative for dysuria and urgency ?Musculoskeletal: Negative for back pain, joint pain, myalgias  ?Neurological: Negative for dizziness and headaches ? ?Blood pressure 124/74, pulse 85, temperature 98.1 ?F (36.7 ?C), temperature source Oral, resp. rate 20, weight 62.8 kg, last menstrual period 01/17/2021, SpO2 98 %. ?General appearance: alert,  cooperative, and no distress ?Lungs: normal respiratory effort ?Heart: regular rate and rhythm ?Abdomen: soft, non-tender; bowel sounds normal ?Extremities: Homans sign is negative, no sign of DVT ?DTR's 2+ ?Presentation: cephalic ?Fetal monitoring   120 bpm, mod var, present acel, no decels ?Uterine activity  q 3-4 ?Dilation: 4.5 ?Effacement (%): 100 ?Station: 0 ?Exam by:: Len Blalock, CNM ? ? ?Prenatal labs: ?ABO, Rh: --/--/A POS (04/16 2329) ?Antibody: NEG (04/16 2329) ?Rubella: 1.86 (11/28 0915) ?RPR: Reactive (01/27 0824)  ?HBsAg: Negative (11/28 0915)  ?HIV: Non Reactive (01/27 0824)  ?GBS: Negative/-- (03/28 1054)  ?1 hr Glucola passed ?Genetic screening  normal ?Anatomy US normal ? ?Prenatal Transfer Tool  ?Maternal Diabetes: No ?Genetic Screening: Normal ?Maternal Ultrasounds/Referrals: Normal ?Fetal Ultrasounds or  other Referrals:  None ?Maternal Substance Abuse:  No ?Significant Maternal Medications:  None ?Significant Maternal Lab Results: Group B Strep negative ? ?Results for orders placed or performed during the hospital encounter of 10/19/21 (from the past 24 hour(s))  ?Amnisure rupture of membrane (rom)not at [facility]  ? Collection Time: 10/19/21 10:33 PM  ?Result Value Ref Range  ? Amnisure ROM NEGATIVE   ?CBC  ? Collection Time: 10/19/21 11:29 PM  ?Result Value Ref Range  ? WBC 9.9 4.0 - 10.5 K/uL  ? RBC 4.74 3.87 - 5.11 MIL/uL  ? Hemoglobin 12.4 12.0 - 15.0 g/dL  ? HCT 39.2 36.0 - 46.0 %  ? MCV 82.7 80.0 - 100.0 fL  ? MCH 26.2 26.0 - 34.0 pg  ? MCHC 31.6 30.0 - 36.0 g/dL  ? RDW 14.2 11.5 - 15.5 %  ? Platelets 142 (L) 150 - 400 K/uL  ? nRBC 0.0 0.0 - 0.2 %  ?Type and screen [place]  ? Collection Time: 10/19/21 11:29 PM  ?Result Value Ref Range  ? ABO/RH(D) A POS   ? Antibody Screen NEG   ? Sample Expiration    ?  10/22/2021,2359 ?Performed at [facility], [place] [address] [place], [place] [address] ?  ? ? ?Assessment: ?Tammie Scott is a 21 y.o. G1P0 with an IUP at 104w3d presenting for labor. Patient made change from 2 to 4.5 and thinned out completely.  ? ?Plan: ?#Labor: expectant management ?#Pain:  Per request ?#FWB Cat 1 ?#ID: GBS: breast ?#MOF:  breast ?#MOC: [facility] outpatient ?#Circ: NA  ? ? ?Starr Lake ?10/20/2021, 12:34 AM ? ?

## 2021-10-20 NOTE — Plan of Care (Signed)

## 2021-10-20 NOTE — Discharge Summary (Signed)
? ?  Postpartum Discharge Summary ? ?   ?Patient Name: Tammie Scott ?DOB: 10/14/2000 ?MRN: [id number] ? ?Date of admission: 10/19/2021 ?Delivery date:10/20/2021  ?Delivering provider: [name]  ?Date of discharge: 10/21/2021 ? ?Admitting diagnosis: Indication for care in labor and delivery, antepartum [O75.9] ?Intrauterine pregnancy: [redacted]w[redacted]d     ?Secondary diagnosis:  Principal Problem: ?  Indication for care in labor and delivery, antepartum ?Active Problems: ?  Supervision of other normal pregnancy, antepartum ?  Generalized anxiety disorder ? ?Additional problems: None    ?Discharge diagnosis: Term Pregnancy Delivered                                              ?Post partum procedures: None ?Augmentation: Pitocin ?Complications: None ? ?Hospital course: Onset of Labor With Vaginal Delivery      ?21 y.o. yo G1P1001 at [redacted]w[redacted]d was admitted in Active Labor on 10/19/2021. Patient had an uncomplicated labor course as follows:  ?Membrane Rupture Time/Date: 12:41 PM ,10/20/2021   ?Delivery Method:Vaginal, Spontaneous  ?Episiotomy: None  ?Lacerations:  Labial;1st degree  ?Patient had an uncomplicated postpartum course.  She is ambulating, tolerating a regular diet, passing flatus, and urinating well. Patient is discharged home in stable condition on 10/21/21. ? ?Newborn Data: ?Birth date:10/20/2021  ?Birth time:1:05 PM  ?Gender:Female  ?Living status:Living  ?Apgars:8 ,9  ?Weight:3230 g  ? ?Magnesium Sulfate received: No ?BMZ received: No ?Rhophylac:N/A ?MMR:N/A ?T-DaP:Given prenatally ?Flu: No ?Transfusion:No ? ?Physical exam  ?Vitals:  ? 10/20/21 1530 10/20/21 2010 10/20/21 2315 10/21/21 0300  ?BP: 105/73 115/78 107/83 103/72  ?Pulse: 63 71 88 71  ?Resp: $Remov'16 17 16 17  'NgprCl$ ?Temp: (!) 97.5 ?F (36.4 ?C) 97.6 ?F (36.4 ?C) 97.6 ?F (36.4 ?C) 97.8 ?F (36.6 ?C)  ?TempSrc: Axillary Oral Oral Oral  ?SpO2: 100% 98% 99% 99%  ?Weight:      ?Height:      ? ?General: alert, cooperative, and no distress ?Lochia: appropriate ?Uterine Fundus:  firm ?Incision: N/A ?DVT Evaluation: No evidence of DVT seen on physical exam. ?Labs: ?Lab Results  ?Component Value Date  ? WBC 11.5 (H) 10/21/2021  ? HGB 10.5 (L) 10/21/2021  ? HCT 33.5 (L) 10/21/2021  ? MCV 83.1 10/21/2021  ? PLT 125 (L) 10/21/2021  ? ? ?  Latest Ref Rng & Units 06/10/2021  ?  5:51 PM  ?CMP  ?Glucose 70 - 99 mg/dL 71    ?BUN 6 - 20 mg/dL 5    ?Creatinine 0.44 - 1.00 mg/dL 0.42    ?Sodium 135 - 145 mmol/L 136    ?Potassium 3.5 - 5.1 mmol/L 3.7    ?Chloride 98 - 111 mmol/L 103    ?CO2 22 - 32 mmol/L 24    ?Calcium 8.9 - 10.3 mg/dL 8.7    ? ?Edinburgh Score: ?   ? View : No data to display.  ?  ?  ?  ? ? ? ?After visit meds:  ?Allergies as of 10/21/2021   ?No Known Allergies ?  ? ?  ?Medication List  ?  ? ?STOP taking these medications   ? ?PrePLUS 27-1 MG Tabs ?  ? ?  ? ?TAKE these medications   ? ?ibuprofen 600 MG tablet ?Commonly known as: ADVIL ?Take 1 tablet (600 mg total) by mouth every 6 (six) hours. ?  ? ?  ? ? ? ?Discharge home in stable  condition ?Infant Feeding: Breast ?Infant Disposition:home with mother ?Discharge instruction: per After Visit Summary and Postpartum booklet. ?Activity: Advance as tolerated. Pelvic rest for 6 weeks.  ?Diet: routine diet ?Future Appointments: ?Future Appointments  ?Date Time Provider [place]  ?11/28/2021 10:15 AM Luvenia Redden, PA-C [facility]  ? ?Follow up Visit: ?Message sent to [facility] by Dr. Cy Blamer on 4/17 ? ?Please schedule this patient for a In person postpartum visit in 4 weeks with the following provider: Any provider. ?Additional Postpartum F/U: None    ?Low risk pregnancy complicated by:  Normal ?Delivery mode:  Vaginal, Spontaneous  ?Anticipated Birth Control:   plans outpatient IUD ? ? ?10/21/2021 ?Christin Fudge, CNM ? ? ? ?

## 2021-10-20 NOTE — Anesthesia Preprocedure Evaluation (Signed)
Anesthesia Evaluation  ?Patient identified by MRN, date of birth, ID band ?Patient awake ? ? ? ?Reviewed: ?Allergy & Precautions, Patient's Chart, lab work & pertinent test results ? ?Airway ?Mallampati: II ? ?TM Distance: >3 FB ?Neck ROM: Full ? ? ? Dental ?  ?Pulmonary ?former smoker,  ?  ?breath sounds clear to auscultation ? ? ? ? ? ? Cardiovascular ?negative cardio ROS ? ? ?Rhythm:Regular Rate:Normal ? ? ?  ?Neuro/Psych ?negative neurological ROS ?   ? GI/Hepatic ?negative GI ROS, Neg liver ROS,   ?Endo/Other  ?negative endocrine ROS ? Renal/GU ?negative Renal ROS  ? ?  ?Musculoskeletal ? ? Abdominal ?  ?Peds ? Hematology ?negative hematology ROS ?(+)   ?Anesthesia Other Findings ? ? Reproductive/Obstetrics ?(+) Pregnancy ? ?  ? ? ? ? ? ? ? ? ? ? ? ? ? ?  ?  ? ? ? ? ? ? ? ? ?Lab Results  ?Component Value Date  ? WBC 9.9 10/19/2021  ? HGB 12.4 10/19/2021  ? HCT 39.2 10/19/2021  ? MCV 82.7 10/19/2021  ? PLT 142 (L) 10/19/2021  ? ?Lab Results  ?Component Value Date  ? CREATININE 0.42 (L) 06/10/2021  ? BUN 5 (L) 06/10/2021  ? NA 136 06/10/2021  ? K 3.7 06/10/2021  ? CL 103 06/10/2021  ? CO2 24 06/10/2021  ? ? ?Anesthesia Physical ?Anesthesia Plan ? ?ASA: 2 ? ?Anesthesia Plan: Epidural  ? ?Post-op Pain Management:   ? ?Induction:  ? ?PONV Risk Score and Plan: Treatment may vary due to age or medical condition ? ?Airway Management Planned: Natural Airway ? ?Additional Equipment:  ? ?Intra-op Plan:  ? ?Post-operative Plan:  ? ?Informed Consent: I have reviewed the patients History and Physical, chart, labs and discussed the procedure including the risks, benefits and alternatives for the proposed anesthesia with the patient or authorized representative who has indicated his/her understanding and acceptance.  ? ? ? ? ? ?Plan Discussed with:  ? ?Anesthesia Plan Comments:   ? ? ? ? ? ? ?Anesthesia Quick Evaluation ? ?

## 2021-10-20 NOTE — Anesthesia Procedure Notes (Signed)
Epidural ?Patient location during procedure: OB ?Start time: 10/20/2021 1:57 AM ?End time: 10/20/2021 12:04 PM ? ?Staffing ?Anesthesiologist: Marcene Duos, MD ?Performed: anesthesiologist  ? ?Preanesthetic Checklist ?Completed: patient identified, IV checked, site marked, risks and benefits discussed, surgical consent, monitors and equipment checked, pre-op evaluation and timeout performed ? ?Epidural ?Patient position: sitting ?Prep: DuraPrep and site prepped and draped ?Patient monitoring: continuous pulse ox and blood pressure ?Approach: midline ?Location: L4-L5 ?Injection technique: LOR air ? ?Needle:  ?Needle type: Tuohy  ?Needle gauge: 17 G ?Needle length: 9 cm and 9 ?Needle insertion depth: 5 cm cm ?Catheter type: closed end flexible ?Catheter size: 19 Gauge ?Catheter at skin depth: 11 (10-->11) cm ?Test dose: negative ? ?Assessment ?Events: blood not aspirated, injection not painful, no injection resistance, no paresthesia and negative IV test ? ? ? ?

## 2021-10-21 ENCOUNTER — Telehealth (HOSPITAL_BASED_OUTPATIENT_CLINIC_OR_DEPARTMENT_OTHER): Payer: Self-pay | Admitting: Obstetrics & Gynecology

## 2021-10-21 LAB — T.PALLIDUM AB, TOTAL: T Pallidum Abs: NONREACTIVE

## 2021-10-21 LAB — CBC
HCT: 33.5 % — ABNORMAL LOW (ref 36.0–46.0)
Hemoglobin: 10.5 g/dL — ABNORMAL LOW (ref 12.0–15.0)
MCH: 26.1 pg (ref 26.0–34.0)
MCHC: 31.3 g/dL (ref 30.0–36.0)
MCV: 83.1 fL (ref 80.0–100.0)
Platelets: 125 10*3/uL — ABNORMAL LOW (ref 150–400)
RBC: 4.03 MIL/uL (ref 3.87–5.11)
RDW: 14.4 % (ref 11.5–15.5)
WBC: 11.5 10*3/uL — ABNORMAL HIGH (ref 4.0–10.5)
nRBC: 0 % (ref 0.0–0.2)

## 2021-10-21 MED ORDER — IBUPROFEN 600 MG PO TABS: 600.0000 mg | ORAL_TABLET | Freq: Four times a day (QID) | ORAL | 0 refills | Status: DC

## 2021-10-21 NOTE — Lactation Note (Signed)
This note was copied from a baby's chart. ?Lactation Consultation Note ? ?Patient Name: Tammie Scott ?Today's Date: 10/21/2021 ?Reason for consult: Follow-up assessment;1st time breastfeeding ?Age:22 hours ? ?P1, Mother attempting to latch when [facility] entered room. ?Mother's nipples are sore.  Provided coconut oil and encouraged applying ebm. ?Assisted with latching with depth.  Latch was uncomfortable at first and baby does not flange upper lip easily.  Demonstrated how to flange upper lip and suggest mother get in a more laid back position for depth. ?Suggest calling for further help as needed. ? ?Maternal Data ?Has patient been taught Hand Expression?: Yes ?Does the patient have breastfeeding experience prior to this delivery?: No ? ?Feeding ?Mother's Current Feeding Choice: Breast Milk ? ?LATCH Score ?Latch: Grasps breast easily, tongue down, lips flanged, rhythmical sucking. ? ?Audible Swallowing: A few with stimulation ? ?Type of Nipple: Everted at rest and after stimulation ? ?Comfort (Breast/Nipple): Filling, red/small blisters or bruises, mild/mod discomfort ? ?Hold (Positioning): Assistance needed to correctly position infant at breast and maintain latch. ? ?LATCH Score: 7 ? ? ?Lactation Tools Discussed/Used ? Coconut oil  ? ?Interventions ?Interventions: Breast feeding basics reviewed;Assisted with latch;Hand express;Coconut oil;Education ? ?Consult Status ?Consult Status: Follow-up ?Date: 10/22/21 ?Follow-up type: In-patient ? ? ? ?[name] ?10/21/2021, 10:43 AM ? ? ? ?

## 2021-10-21 NOTE — Telephone Encounter (Signed)
Called patient and left a message to please give the office a call back to set up 4 week postpartum. ?

## 2021-10-21 NOTE — Lactation Note (Signed)
This note was copied from a baby's chart. ?Lactation Consultation Note ? ?Patient Name: Tammie Scott ?Today's Date: 10/21/2021 ?Reason for consult: Term;1st time breastfeeding;Follow-up assessment ?Age:22 hours ?Per mom, infant been sleepy and BF 5 to 10 minutes. ?LC did not observe latch, infant was asleep and mom had recently BF infant.  ?LC suggested mom BF infant skin to skin and attempt to latch infant on both breast during a feeding and ask for further latch support if needed. ?LC observed infant has recessive chin, LC discussed extending infant's lower jaw to help with latch and continue to flange top lip outward. ?Mom will continue to BF infant on demand, 8 to 12+ or more times within 24 hours, skin to skin.  ?RN was setting mom up with using a DEBP as LC was leaving the room, mom plans to pump every 3 hours for 15 minutes on initial setting, give infant back any EBM after latching infant at the breast, this is mom's choice.  ?Maternal Data ?  ? ?Feeding ?  ? ?LATCH Score ?  ? ?  ? ?  ? ?  ? ?  ? ?  ? ? ?Lactation Tools Discussed/Used ?Tools: Pump ?Breast pump type: Double-Electric Breast Pump ?Pump Education: Setup, frequency, and cleaning ?Reason for Pumping: Mother request ?Pumping frequency: every 3 hours per LC ? ?Interventions ?  ? ?Discharge ?  ? ?Consult Status ?Consult Status: Follow-up ?Date: 10/22/21 ?Follow-up type: In-patient ? ? ? ?Danelle Earthly ?10/21/2021, 11:25 PM ? ? ? ?

## 2021-10-21 NOTE — Progress Notes (Signed)
CSW received consult for hx of Anxiety.  CSW met with MOB to offer support and complete assessment, FOB present. MOB granted CSW verbal permission to speak in front of FOB about anything. MOB was pleasant but hesitant at times when asked questions. FOB was pleasant and encouraged MOB to share. CSW and MOB discussed MOB's mental health history. MOB reported that she was diagnosed with Anxiety maybe 5 years ago. MOB denied any current symptoms and reported that she wasn't taking any medication nor participating in therapy for anxiety. MOB denied needing any therapy resources. CSW inquired about any additional mental health history. MOB was apprehensive and then shared about having depressive episodes during pregnancy. MOB described the depressive episodes as being sad, isolating, and being tearful, noting they would last a day. MOB was unable to identify any patterns/triggers related to the depressive episodes and reported that it was sporadic throughout the pregnancy. CSW reassured MOB there was no shame in her experience. CSW inquired about any coping skills, MOB reported none and reported that the episodes just went away on their own. CSW discussed the importance of rest and sunlight for MOB postpartum. MOB denied any additional mental health history. CSW inquired about how MOB was feeling emotionally since giving birth, MOB reported that she was feeling okay. MOB presented calm and did not demonstrate any acute mental health signs/symptoms. CSW assessed for safety, MOB denied SI and HI. CSW did not assess for domestic violence as FOB was present. CSW inquired about MOB's support system, MOB reported that FOB and her mom are supports. CSW informed MOB that she may be more susceptible to postpartum depression due to her mental health history.   ? ?CSW provided education regarding the baby blues period vs. perinatal mood disorders, discussed treatment and gave resources for mental health follow up if concerns arise.   CSW recommends self-evaluation during the postpartum time period using the New Mom Checklist from Postpartum Progress and encouraged MOB to contact a medical professional if symptoms are noted at any time.   ? ?CSW provided review of Sudden Infant Death Syndrome (SIDS) precautions. MOB verbalized understanding and reported that they have all items needed to care for infant including a car seat and crib.  ? ?CSW identifies no further need for intervention and no barriers to discharge at this time. ? ?Darol Cush, LCSW ?Clinical Social Worker ?[facility] ?Cell#: [phone] ? ? ?

## 2021-10-21 NOTE — Anesthesia Postprocedure Evaluation (Signed)
Anesthesia Post Note ? ?Patient: Cheryll Keisler ? ?Procedure(s) Performed: AN AD HOC LABOR EPIDURAL ? ?  ? ?Patient location during evaluation: Mother Baby ?Anesthesia Type: Epidural ?Level of consciousness: awake ?Pain management: satisfactory to patient ?Vital Signs Assessment: post-procedure vital signs reviewed and stable ?Respiratory status: spontaneous breathing ?Cardiovascular status: stable ?Anesthetic complications: no ? ? ?No notable events documented. ? ?Last Vitals:  ?Vitals:  ? 10/21/21 0300 10/21/21 0816  ?BP: 103/72 110/65  ?Pulse: 71 70  ?Resp: 17 18  ?Temp: 36.6 ?C (!) 36.1 ?C  ?SpO2: 99% 100%  ?  ?Last Pain:  ?Vitals:  ? 10/21/21 0816  ?TempSrc: Axillary  ?PainSc:   ? ?Pain Goal:   ? ?  ?  ?  ?  ?  ?  ?  ? ?Raffael Bugarin ? ? ? ? ?

## 2021-10-22 ENCOUNTER — Encounter: Payer: 59 | Admitting: Obstetrics and Gynecology

## 2021-10-22 ENCOUNTER — Encounter (HOSPITAL_BASED_OUTPATIENT_CLINIC_OR_DEPARTMENT_OTHER): Payer: 59 | Admitting: Obstetrics & Gynecology

## 2021-10-22 NOTE — Discharge Summary (Signed)
? ?  Postpartum Discharge Summary ? ?   ?Patient Name: Tammie Scott ?DOB: 10-01-2000 ?MRN: [id number] ? ?Date of admission: 10/19/2021 ?Delivery date:10/20/2021  ?Delivering provider: [name]  ?Date of discharge: 10/22/2021 ? ?Admitting diagnosis: Indication for care in labor and delivery, antepartum [O75.9] ?Intrauterine pregnancy: [redacted]w[redacted]d     ?Secondary diagnosis:  Principal Problem: ?  Indication for care in labor and delivery, antepartum ?Active Problems: ?  Supervision of other normal pregnancy, antepartum ?  Generalized anxiety disorder ? ?Additional problems: None    ?Discharge diagnosis: Term Pregnancy Delivered                                              ?Post partum procedures: None ?Augmentation: Pitocin ?Complications: None ? ?Hospital course: Onset of Labor With Vaginal Delivery      ?21 y.o. yo G1P1001 at [redacted]w[redacted]d was admitted in Active Labor on 10/19/2021. Patient had an uncomplicated labor course as follows:  ?Membrane Rupture Time/Date: 12:41 PM ,10/20/2021   ?Delivery Method:Vaginal, Spontaneous  ?Episiotomy: None  ?Lacerations:  Labial;1st degree  ?Patient had an uncomplicated postpartum course.  She is ambulating, tolerating a regular diet, passing flatus, and urinating well. Patient is discharged home in stable condition on 10/22/21. ? ?Newborn Data: ?Birth date:10/20/2021  ?Birth time:1:05 PM  ?Gender:Female  ?Living status:Living  ?Apgars:8 ,9  ?Weight:3230 g  ? ?Magnesium Sulfate received: No ?BMZ received: No ?Rhophylac:N/A ?MMR:N/A ?T-DaP:Given prenatally ?Flu: No ?Transfusion:No ? ?Physical exam  ?Vitals:  ? 10/21/21 0816 10/21/21 1458 10/21/21 2055 10/22/21 0458  ?BP: 110/65 112/65 109/76 (!) 97/59  ?Pulse: 70 80 73 77  ?Resp: $Remov'18  18 18  'kPMdFm$ ?Temp: (!) 96.9 ?F (36.1 ?C) 98.2 ?F (36.8 ?C) 99.1 ?F (37.3 ?C) 98 ?F (36.7 ?C)  ?TempSrc: Axillary Oral Oral Oral  ?SpO2: 100% 99% 99% 99%  ?Weight:      ?Height:      ? ?General: alert, cooperative, and no distress ?Lochia: appropriate ?Uterine Fundus:  firm ?Incision: N/A ?DVT Evaluation: No evidence of DVT seen on physical exam. ?Labs: ?Lab Results  ?Component Value Date  ? WBC 11.5 (H) 10/21/2021  ? HGB 10.5 (L) 10/21/2021  ? HCT 33.5 (L) 10/21/2021  ? MCV 83.1 10/21/2021  ? PLT 125 (L) 10/21/2021  ? ? ?  Latest Ref Rng & Units 06/10/2021  ?  5:51 PM  ?CMP  ?Glucose 70 - 99 mg/dL 71    ?BUN 6 - 20 mg/dL 5    ?Creatinine 0.44 - 1.00 mg/dL 0.42    ?Sodium 135 - 145 mmol/L 136    ?Potassium 3.5 - 5.1 mmol/L 3.7    ?Chloride 98 - 111 mmol/L 103    ?CO2 22 - 32 mmol/L 24    ?Calcium 8.9 - 10.3 mg/dL 8.7    ? ?Edinburgh Score: ?   ? View : No data to display.  ?  ?  ?  ? ? ? ?After visit meds:  ?Allergies as of 10/22/2021   ?No Known Allergies ?  ? ?  ?Medication List  ?  ? ?STOP taking these medications   ? ?PrePLUS 27-1 MG Tabs ?  ? ?  ? ?TAKE these medications   ? ?ibuprofen 600 MG tablet ?Commonly known as: ADVIL ?Take 1 tablet (600 mg total) by mouth every 6 (six) hours. ?  ? ?  ? ? ? ?Discharge home in  stable condition ?Infant Feeding: Breast ?Infant Disposition:home with mother ?Discharge instruction: per After Visit Summary and Postpartum booklet. ?Activity: Advance as tolerated. Pelvic rest for 6 weeks.  ?Diet: routine diet ?Future Appointments: ?Future Appointments  ?Date Time Provider [place]  ?11/28/2021 10:15 AM Luvenia Redden, PA-C [facility]  ? ?Follow up Visit: ?Message sent to [facility] by Dr. Cy Blamer on 4/17 ? ?Please schedule this patient for a In person postpartum visit in 4 weeks with the following provider: Any provider. ?Additional Postpartum F/U: None    ?Low risk pregnancy complicated by:  Normal ?Delivery mode:  Vaginal, Spontaneous  ?Anticipated Birth Control:   plans outpatient IUD ? ? ?10/22/2021 ?Alen Bleacher, MD ? ?CNM DISCHARGE ATTESTATION ? ?I have seen and examined this patient and agree with above documentation in the resident's note.  ?Increased RPR titer, negative T. Pal ?Preemptive teaching by CNM: indications for evaluation in  MAU in first six weeks postpartum ? ?Mallie Snooks, MSA, MSN, CNM ?Certified Nurse Midwife, [place] ?Center for [place] ? ? ? ?

## 2021-10-22 NOTE — Lactation Note (Addendum)
This note was copied from a baby's chart. ?Lactation Consultation Note ? ?Patient Name: Tammie Scott ?Today's Date: 10/22/2021 ?Reason for consult: Follow-up assessment;Mother's request;Difficult latch;Term;Breastfeeding assistance ?Age:22 hours ? ?LC assisted latching infant at the breast with signs of milk transfer. LC able to get pain reduced from 4 to 0.  ?Mom using coconut oil for nipple care. Nipple soreness and a blister. Mom trouble latching infant on the right side, we were successful using a football hold.  ? ?Infant adequate urine and stool output to account for weight loss.  ? ?Plan 1. To feed based on cues 8-12x 24hr period. Mom to offer breasts and look for signs of milk transfer.  ?2. Mom to supplement 7-12 ml per feeding using slow flow nipple and pace bottle feeding.  ?3. Manual pump q 3hrs for 10 min each breast.  ?All questions answered at the end of the visit.  ? ?Maternal Data ?Has patient been taught Hand Expression?: Yes ? ?Feeding ?Mother's Current Feeding Choice: Breast Milk ? ?LATCH Score ?Latch: Repeated attempts needed to sustain latch, nipple held in mouth throughout feeding, stimulation needed to elicit sucking reflex. ? ?Audible Swallowing: Spontaneous and intermittent ? ?Type of Nipple: Everted at rest and after stimulation ? ?Comfort (Breast/Nipple): Filling, red/small blisters or bruises, mild/mod discomfort ? ?Hold (Positioning): Assistance needed to correctly position infant at breast and maintain latch. ? ?LATCH Score: 7 ? ? ?Lactation Tools Discussed/Used ?Tools: Coconut oil;Flanges;Pump ?Flange Size: 27 ?Breast pump type: Manual ?Pump Education: Setup, frequency, and cleaning;Milk Storage ?Reason for Pumping: increase stimulation ?Pumping frequency: every 3 hrs for 15 min ? ?Interventions ?Interventions: Breast feeding basics reviewed;Assisted with latch;Skin to skin;Hand express;Breast compression;Adjust position;Support pillows;Position options;Expressed milk;Coconut  oil;Hand pump;Education;Pace feeding;Theme park manager brochure ? ?Discharge ?Discharge Education: Engorgement and breast care;Warning signs for feeding baby;Outpatient recommendation ?Pump: Personal ?WIC Program: No ? ?Consult Status ?Consult Status: Complete ?Date: 10/22/21 ? ? ? ?Willeen Novak  N[place] ?10/22/2021, 1:20 PM ? ? ? ?

## 2021-10-28 ENCOUNTER — Encounter (HOSPITAL_BASED_OUTPATIENT_CLINIC_OR_DEPARTMENT_OTHER): Payer: 59 | Admitting: Obstetrics & Gynecology

## 2021-10-29 ENCOUNTER — Telehealth (HOSPITAL_COMMUNITY): Payer: Self-pay | Admitting: *Deleted

## 2021-10-29 NOTE — Telephone Encounter (Signed)
Attempted Hospital Discharge Follow-Up Call.  Left voice mail requesting that patient return RN's phone call.  

## 2021-10-30 ENCOUNTER — Encounter: Payer: 59 | Admitting: Obstetrics and Gynecology

## 2021-11-20 ENCOUNTER — Encounter (HOSPITAL_BASED_OUTPATIENT_CLINIC_OR_DEPARTMENT_OTHER): Payer: Self-pay

## 2021-11-28 ENCOUNTER — Ambulatory Visit (HOSPITAL_BASED_OUTPATIENT_CLINIC_OR_DEPARTMENT_OTHER): Payer: 59 | Admitting: Medical

## 2021-12-02 ENCOUNTER — Ambulatory Visit (INDEPENDENT_AMBULATORY_CARE_PROVIDER_SITE_OTHER): Payer: 59 | Admitting: Obstetrics & Gynecology

## 2021-12-02 ENCOUNTER — Encounter (HOSPITAL_BASED_OUTPATIENT_CLINIC_OR_DEPARTMENT_OTHER): Payer: Self-pay | Admitting: Obstetrics & Gynecology

## 2021-12-02 ENCOUNTER — Other Ambulatory Visit (HOSPITAL_COMMUNITY)
Admission: RE | Admit: 2021-12-02 | Discharge: 2021-12-02 | Disposition: A | Discharge status: Home / Self Care | Payer: 59 | Source: Ambulatory Visit | Attending: Obstetrics & Gynecology | Admitting: Obstetrics & Gynecology

## 2021-12-02 DIAGNOSIS — Z124 Encounter for screening for malignant neoplasm of cervix: Secondary | ICD-10-CM | POA: Insufficient documentation

## 2021-12-02 DIAGNOSIS — Z30011 Encounter for initial prescription of contraceptive pills: Secondary | ICD-10-CM | POA: Diagnosis not present

## 2021-12-02 DIAGNOSIS — Z7251 High risk heterosexual behavior: Secondary | ICD-10-CM | POA: Diagnosis not present

## 2021-12-02 DIAGNOSIS — Z3202 Encounter for pregnancy test, result negative: Secondary | ICD-10-CM

## 2021-12-02 LAB — POCT URINE PREGNANCY: Preg Test, Ur: NEGATIVE

## 2021-12-02 MED ORDER — NORETHINDRONE 0.35 MG PO TABS: 1.0000 | ORAL_TABLET | Freq: Every day | ORAL | 3 refills | Status: DC

## 2021-12-02 NOTE — Progress Notes (Signed)
[place] Partum Visit Note  Tammie Scott is a 21 y.o. G60P1001 female who presents for a postpartum visit. She is 6 weeks postpartum following a normal spontaneous vaginal delivery.  I have fully reviewed the prenatal and intrapartum course. The delivery was at 39 3/7 gestational weeks.  Anesthesia: epidural. Postpartum course has been normal. Baby is doing well. Baby is feeding by breast. Bleeding no bleeding. Bowel function is normal. Bladder function is normal. Patient is sexually active. Contraception method is  POPs . Postpartum depression screening: negative.   The pregnancy intention screening data noted above was reviewed. Potential methods of contraception were discussed. The patient elected to proceed with POPs.  Questions about IUD and vasectomy answered.      Health Maintenance Due  Topic Date Due   HPV VACCINES (2 - 2-dose series) 10/19/2014   PAP-Cervical Cytology Screening  Never done   PAP SMEAR-Modifier  Never done    The following portions of the patient's history were reviewed and updated as appropriate: allergies, current medications, past family history, past medical history, past social history, past surgical history, and problem list.  Review of Systems Pertinent items noted in HPI and remainder of comprehensive ROS otherwise negative.  Objective:  BP 116/62   Pulse 74   Ht 5\' 2"  (1.575 m)   Wt 122 lb 6.4 oz (55.5 kg)   Breastfeeding Yes   BMI 22.39 kg/m    General:  alert and no distress   Breasts:  normal  Lungs: clear to auscultation bilaterally  Heart:  regular rate and rhythm, S1, S2 normal, no murmur, click, rub or gallop  Abdomen: soft, non-tender; bowel sounds normal; no masses,  no organomegaly   Wound N/a  GU exam:  normal appearing external genitalia, ob laceration well healed, cervix parous in appearance, no lesions, uterus mobile and non tender and about 8 weeks size       Assessment:    1. Postpartum care and examination - normal exam  today  2. Encounter for initial prescription of contraceptive pills - norethindrone (MICRONOR) 0.35 MG tablet; Take 1 tablet (0.35 mg total) by mouth daily.  Dispense: 84 tablet; Refill: 3 - administration, failure rate discussed.  Advised if pt desires to use IUD, please call and schedule appt  3. Cervical cancer screening - Cytology - PAP( [place])  4. Unprotected sexual intercourse - UPT negative today   Plan:   Essential components of care per ACOG recommendations:  1.  Mood and well being: Patient with  depression screening today. Reviewed local resources for support.  - Patient tobacco use? No.   - hx of drug use? No.    2. Infant care and feeding:  -Patient currently breastmilk feeding? Yes. Discussed returning to work and pumping.  -Social determinants of health ([place]) reviewed in [place].   3. Sexuality, contraception and birth spacing - Patient does not want a pregnancy in the next year.  Desired family size is 1 children.  - Reviewed reproductive life planning. Reviewed contraceptive methods based on pt preferences and effectiveness.  Patient desired Oral Contraceptive today.  Rx for POPs prescribed. - Discussed birth spacing of 18 months  4. Sleep and fatigue -Encouraged family/partner/community support of 4 hrs of uninterrupted sleep to help with mood and fatigue  5. Physical Recovery  - Discussed patients delivery and complications. She describes her labor as .   - Patient had a Vaginal, no problems at delivery. Patient had a 2nd degree laceration. Perineal healing reviewed.  Patient expressed understanding - Patient has urinary incontinence? No. - Patient is safe to resume physical and sexual activity  6.  Health Maintenance - HM due items addressed Yes - Last pap smear No results found for: DIAGPAP Pap smear done at today's visit.  -Breast Cancer screening indicated? No.   7. Chronic Disease/Pregnancy Condition follow up: None  - PCP follow up  Megan Salon, [place] for [place], [place]

## 2021-12-04 LAB — CYTOLOGY - PAP

## 2021-12-26 ENCOUNTER — Ambulatory Visit (HOSPITAL_BASED_OUTPATIENT_CLINIC_OR_DEPARTMENT_OTHER): Payer: 59 | Admitting: Obstetrics & Gynecology

## 2022-04-03 ENCOUNTER — Ambulatory Visit (INDEPENDENT_AMBULATORY_CARE_PROVIDER_SITE_OTHER): Payer: 59 | Admitting: Medical

## 2022-04-03 ENCOUNTER — Encounter (HOSPITAL_BASED_OUTPATIENT_CLINIC_OR_DEPARTMENT_OTHER): Payer: Self-pay | Admitting: Medical

## 2022-04-03 ENCOUNTER — Other Ambulatory Visit (HOSPITAL_COMMUNITY)
Admission: RE | Admit: 2022-04-03 | Discharge: 2022-04-03 | Disposition: A | Discharge status: Home / Self Care | Payer: 59 | Source: Ambulatory Visit

## 2022-04-03 ENCOUNTER — Other Ambulatory Visit (HOSPITAL_COMMUNITY)
Admission: RE | Admit: 2022-04-03 | Discharge: 2022-04-03 | Disposition: A | Discharge status: Home / Self Care | Payer: 59 | Source: Ambulatory Visit | Attending: Medical | Admitting: Medical

## 2022-04-03 VITALS — BP 100/64 | HR 65 | Ht 62.0 in | Wt 122.4 lb

## 2022-04-03 DIAGNOSIS — R87612 Low grade squamous intraepithelial lesion on cytologic smear of cervix (LGSIL): Secondary | ICD-10-CM | POA: Diagnosis not present

## 2022-04-03 DIAGNOSIS — Z113 Encounter for screening for infections with a predominantly sexual mode of transmission: Secondary | ICD-10-CM | POA: Diagnosis present

## 2022-04-03 NOTE — Addendum Note (Signed)
Addended by: Blenda Nicely on: 04/03/2022 10:23 AM   Modules accepted: Orders

## 2022-04-03 NOTE — Addendum Note (Signed)
Addended by: Blenda Nicely on: 04/03/2022 11:43 AM   Modules accepted: Orders

## 2022-04-03 NOTE — Progress Notes (Signed)
    GYNECOLOGY CLINIC COLPOSCOPY PROCEDURE NOTE  [name] is a 21 y.o. G1P1001 here for colposcopy for  LGSIL with some cells suggestive of High Grade  pap smear on 12/02/21. Discussed role for HPV in cervical dysplasia, need for surveillance.  Patient given informed consent, signed copy in the chart, time out was performed.  Placed in lithotomy position. Cervix viewed with speculum and colposcope after application of acetic acid.   Colposcopy adequate? Yes  no visible lesions, no mosaicism, no punctation, and no abnormal vasculature; no biopsies obtained.  ECC specimen obtained. All specimens were labelled and sent to pathology.  Patient was given post procedure instructions.  Will follow up pathology and manage accordingly.  Routine preventative health maintenance measures emphasized.   Luvenia Redden, PA-C 04/03/2022 9:43 AM

## 2022-04-06 LAB — CERVICOVAGINAL ANCILLARY ONLY
Chlamydia: NEGATIVE
Comment: NEGATIVE
Comment: NEGATIVE
Comment: NORMAL
Neisseria Gonorrhea: NEGATIVE
Trichomonas: NEGATIVE

## 2022-04-06 LAB — SURGICAL PATHOLOGY

## 2022-06-17 ENCOUNTER — Encounter (HOSPITAL_COMMUNITY): Payer: Self-pay | Admitting: Obstetrics and Gynecology

## 2022-06-17 ENCOUNTER — Inpatient Hospital Stay (HOSPITAL_COMMUNITY): Payer: Medicaid Other

## 2022-06-17 ENCOUNTER — Inpatient Hospital Stay (HOSPITAL_COMMUNITY)
Admission: AD | Admit: 2022-06-17 | Discharge: 2022-06-17 | Disposition: A | Discharge status: Home / Self Care | Payer: Medicaid Other | Attending: Obstetrics and Gynecology | Admitting: Obstetrics and Gynecology

## 2022-06-17 ENCOUNTER — Other Ambulatory Visit: Payer: Self-pay

## 2022-06-17 DIAGNOSIS — Z3A14 14 weeks gestation of pregnancy: Secondary | ICD-10-CM | POA: Diagnosis not present

## 2022-06-17 DIAGNOSIS — Z3A12 12 weeks gestation of pregnancy: Secondary | ICD-10-CM | POA: Diagnosis not present

## 2022-06-17 DIAGNOSIS — O26892 Other specified pregnancy related conditions, second trimester: Secondary | ICD-10-CM

## 2022-06-17 DIAGNOSIS — O208 Other hemorrhage in early pregnancy: Secondary | ICD-10-CM | POA: Diagnosis not present

## 2022-06-17 DIAGNOSIS — N898 Other specified noninflammatory disorders of vagina: Secondary | ICD-10-CM | POA: Diagnosis not present

## 2022-06-17 LAB — URINALYSIS, ROUTINE W REFLEX MICROSCOPIC
Bilirubin Urine: NEGATIVE
Glucose, UA: NEGATIVE mg/dL
Hgb urine dipstick: NEGATIVE
Ketones, ur: 20 mg/dL — AB
Nitrite: NEGATIVE
Protein, ur: NEGATIVE mg/dL
Specific Gravity, Urine: 1.028 (ref 1.005–1.030)
pH: 5 (ref 5.0–8.0)

## 2022-06-17 LAB — WET PREP, GENITAL
Clue Cells Wet Prep HPF POC: NONE SEEN
Sperm: NONE SEEN
Trich, Wet Prep: NONE SEEN
WBC, Wet Prep HPF POC: >=10 — AB (ref <10)
Yeast Wet Prep HPF POC: NONE SEEN

## 2022-06-17 NOTE — MAU Note (Signed)
Tammie Scott is a 21 y.o. here in MAU reporting: last night was having bad cramping and then had some leaking that was yellow. No odor or discharge. Still having some abdominal pain but states it is not as bad as last night. States she is about [redacted] weeks pregnant.   LMP: 03/10/22, states spotting, is exclusively breastfeeding 8 month other daughter  Onset of complaint: last night  Pain score: 3/10  Vitals:   06/17/22 1321  BP: 108/63  Pulse: 71  Resp: 16  Temp: 98.1 F (36.7 C)  SpO2: 95%     FHT:155  Lab orders placed from triage: ua

## 2022-06-18 LAB — GC/CHLAMYDIA PROBE AMP (~~LOC~~) NOT AT ARMC
Chlamydia: NEGATIVE
Comment: NEGATIVE
Comment: NORMAL
Neisseria Gonorrhea: NEGATIVE

## 2022-06-22 DIAGNOSIS — Z114 Encounter for screening for human immunodeficiency virus [HIV]: Secondary | ICD-10-CM | POA: Diagnosis not present

## 2022-06-22 DIAGNOSIS — O09899 Supervision of other high risk pregnancies, unspecified trimester: Secondary | ICD-10-CM | POA: Diagnosis not present

## 2022-06-22 DIAGNOSIS — Z0389 Encounter for observation for other suspected diseases and conditions ruled out: Secondary | ICD-10-CM | POA: Diagnosis not present

## 2022-06-22 DIAGNOSIS — Z31430 Encounter of female for testing for genetic disease carrier status for procreative management: Secondary | ICD-10-CM | POA: Diagnosis not present

## 2022-06-22 DIAGNOSIS — Z8279 Family history of other congenital malformations, deformations and chromosomal abnormalities: Secondary | ICD-10-CM | POA: Diagnosis not present

## 2022-06-22 DIAGNOSIS — Z113 Encounter for screening for infections with a predominantly sexual mode of transmission: Secondary | ICD-10-CM | POA: Diagnosis not present

## 2022-06-22 DIAGNOSIS — Z3009 Encounter for other general counseling and advice on contraception: Secondary | ICD-10-CM | POA: Diagnosis not present

## 2022-06-22 DIAGNOSIS — Z3482 Encounter for supervision of other normal pregnancy, second trimester: Secondary | ICD-10-CM | POA: Diagnosis not present

## 2022-06-22 DIAGNOSIS — Z1388 Encounter for screening for disorder due to exposure to contaminants: Secondary | ICD-10-CM | POA: Diagnosis not present

## 2022-06-22 LAB — OB RESULTS CONSOLE ABO/RH: RH Type: POSITIVE

## 2022-06-22 LAB — OB RESULTS CONSOLE HEPATITIS B SURFACE ANTIGEN: Hepatitis B Surface Ag: NEGATIVE

## 2022-06-22 LAB — HEPATITIS C ANTIBODY: HCV Ab: NEGATIVE

## 2022-06-22 LAB — OB RESULTS CONSOLE PLATELET COUNT: Platelets: 193

## 2022-06-22 LAB — OB RESULTS CONSOLE HGB/HCT, BLOOD
HCT: 40 (ref 29–41)
Hemoglobin: 13.3

## 2022-06-22 LAB — OB RESULTS CONSOLE RUBELLA ANTIBODY, IGM: Rubella: IMMUNE

## 2022-06-22 LAB — OB RESULTS CONSOLE HIV ANTIBODY (ROUTINE TESTING): HIV: NONREACTIVE

## 2022-06-22 LAB — OB RESULTS CONSOLE RPR: RPR: REACTIVE

## 2022-06-25 DIAGNOSIS — Z3009 Encounter for other general counseling and advice on contraception: Secondary | ICD-10-CM | POA: Diagnosis not present

## 2022-06-25 DIAGNOSIS — Z1388 Encounter for screening for disorder due to exposure to contaminants: Secondary | ICD-10-CM | POA: Diagnosis not present

## 2022-06-25 DIAGNOSIS — Z0389 Encounter for observation for other suspected diseases and conditions ruled out: Secondary | ICD-10-CM | POA: Diagnosis not present

## 2022-07-06 DIAGNOSIS — Z419 Encounter for procedure for purposes other than remedying health state, unspecified: Secondary | ICD-10-CM | POA: Diagnosis not present

## 2022-07-06 NOTE — L&D Delivery Note (Signed)
OB/GYN Faculty Practice Delivery Note  Tammie Scott is a 22 y.o. [id number] s/p VD at [redacted]w[redacted]d. She was admitted for IOL ICP.   ROM: 8h 86m with clear fluid GBS Status: Negative/-- (05/24 1259) Maximum Maternal Temperature: 98.4 F  Labor Progress: Initial SVE: 3.5/90/0. She then progressed to complete.   Delivery Date/Time: 12/09/22 At 2316 Delivery: Called to room and patient was complete and pushing. Head delivered middle OA. No nuchal cord present. Shoulder and body delivered in usual fashion. Infant with spontaneous cry, placed on mother's abdomen, dried and stimulated. Cord clamped x 2 after 1-minute delay, and cut by FOB. Cord blood drawn. Placenta delivered spontaneously with gentle cord traction. Fundus firm with massage and Pitocin. Labia, perineum, vagina, and cervix inspected with bilateral periurethral lacerations, both hemostatic.  Baby Weight: pending  Placenta: 3 vessel, intact.  Given to patient Complications: None Lacerations: As above EBL: 187 mL Analgesia: None  Infant:  APGAR (1 MIN): 8   APGAR (5 MINS): 9    Myrtie Hawk, DO OB Family Medicine Fellow, [facility] for [facility], [facility] 12/09/2022, 11:37 PM

## 2022-08-06 DIAGNOSIS — Z419 Encounter for procedure for purposes other than remedying health state, unspecified: Secondary | ICD-10-CM | POA: Diagnosis not present

## 2022-08-17 ENCOUNTER — Ambulatory Visit: Payer: Medicaid Other

## 2022-08-17 ENCOUNTER — Encounter: Payer: Medicaid Other | Attending: Obstetrics and Gynecology | Admitting: *Deleted

## 2022-08-17 ENCOUNTER — Ambulatory Visit: Payer: Medicaid Other | Attending: Obstetrics & Gynecology

## 2022-08-17 ENCOUNTER — Encounter: Payer: Self-pay | Admitting: *Deleted

## 2022-08-17 ENCOUNTER — Other Ambulatory Visit: Payer: Self-pay | Admitting: Obstetrics & Gynecology

## 2022-08-17 ENCOUNTER — Other Ambulatory Visit: Payer: Self-pay | Admitting: *Deleted

## 2022-08-17 DIAGNOSIS — Z810 Family history of intellectual disabilities: Secondary | ICD-10-CM | POA: Insufficient documentation

## 2022-08-17 DIAGNOSIS — Z3A21 21 weeks gestation of pregnancy: Secondary | ICD-10-CM | POA: Diagnosis not present

## 2022-08-17 DIAGNOSIS — Z362 Encounter for other antenatal screening follow-up: Secondary | ICD-10-CM | POA: Insufficient documentation

## 2022-08-17 DIAGNOSIS — Z363 Encounter for antenatal screening for malformations: Secondary | ICD-10-CM

## 2022-08-17 DIAGNOSIS — O09892 Supervision of other high risk pregnancies, second trimester: Secondary | ICD-10-CM | POA: Insufficient documentation

## 2022-08-20 ENCOUNTER — Ambulatory Visit: Payer: Medicaid Other | Attending: Obstetrics & Gynecology

## 2022-08-20 ENCOUNTER — Telehealth: Payer: Self-pay | Admitting: Obstetrics and Gynecology

## 2022-08-20 NOTE — Telephone Encounter (Signed)
PC to patient by Theodis Sato to remind patient of visit and no answer. We are happy to reschedule the visit if desired and can be reached at [phone] or [phone].  Wilburt Finlay, MS, CGC

## 2022-08-25 IMAGING — CT CT HEAD W/O CM
4 series · 15 of 47 positions shown, 17 images · non-contrast
Comparison: None.

CLINICAL DATA: Recent syncopal episode, initial encounter

EXAM:
CT HEAD WITHOUT CONTRAST
TECHNIQUE: Contiguous axial images were obtained from the base of the skull
through the vertex without intravenous contrast.

[Series 3: head without · axial · non-contrast · 0.41mm/px · z∈[+1287,+1407]mm · 7 of 33 slices shown, 9 images]
[im 5/33  brain]
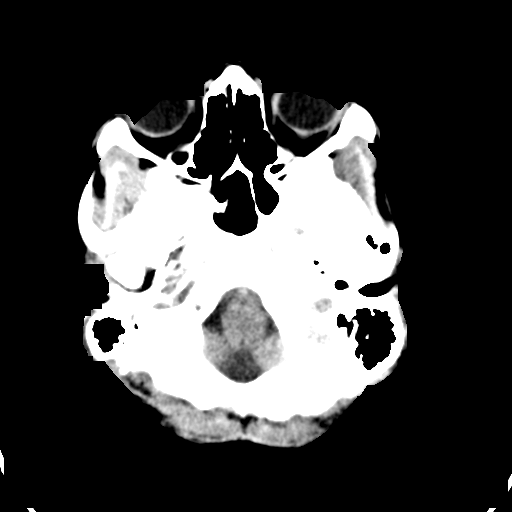
[im 5/33  bone]
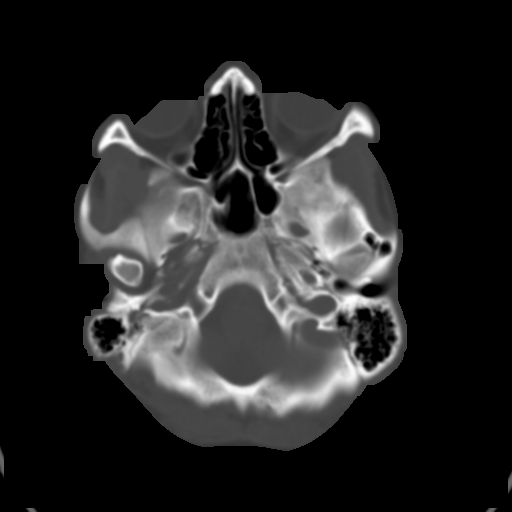
[im 9/33  brain]
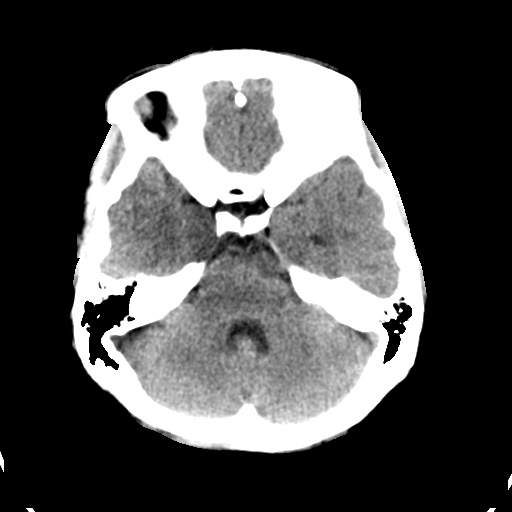
[im 13/33  brain]
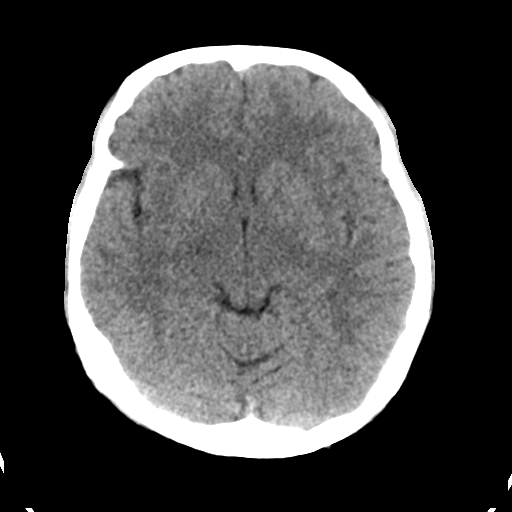
[im 17/33  brain]
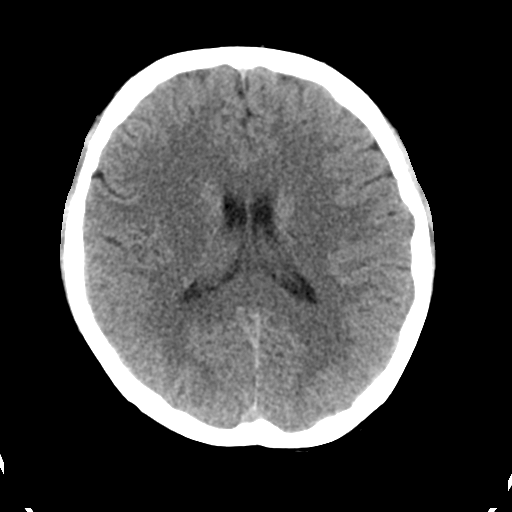
[im 21/33  brain]
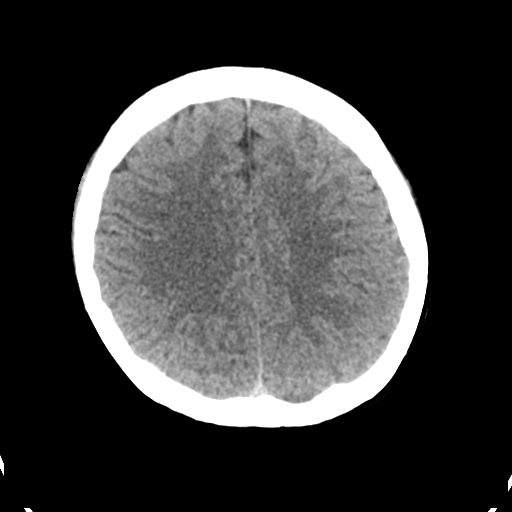
[im 21/33  bone]
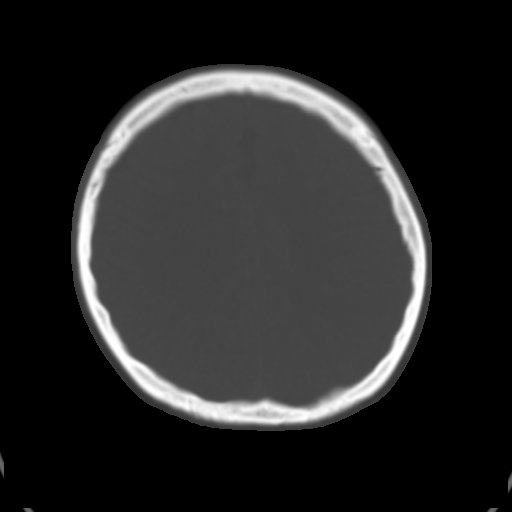
[im 25/33  brain]
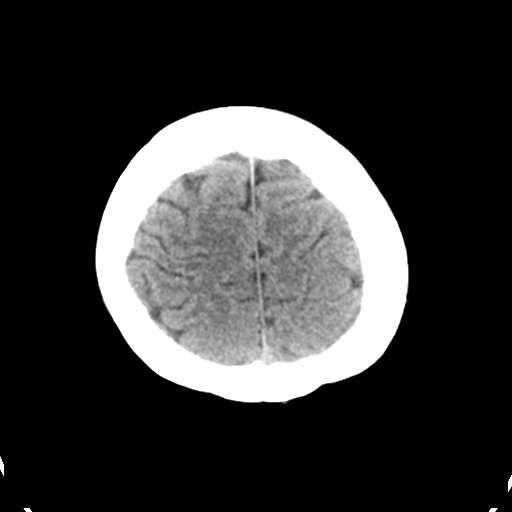
[im 29/33  brain]
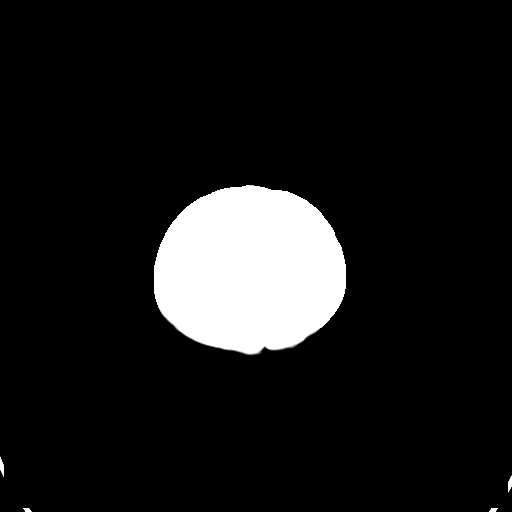

[Series 4: head bone · axial · 0.41mm/px · z∈[+1283,+1299]mm · 2 of 81 slices shown]
[im 9/81  bone]
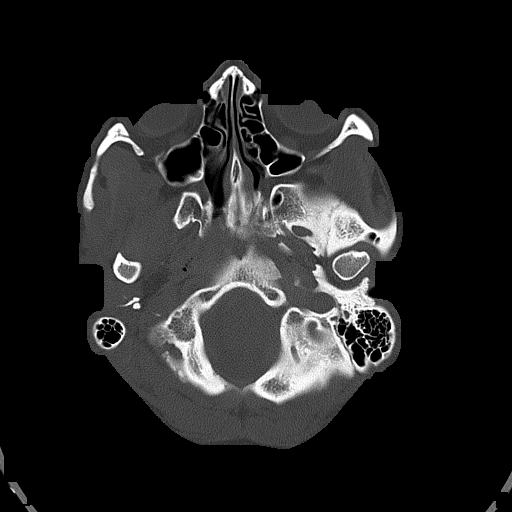
[im 17/81  bone]
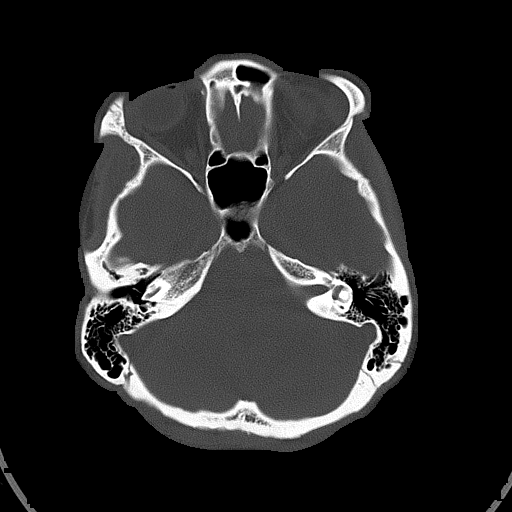

[Series 5: head without cor · coronal · non-contrast · 0.31mm/px · 3 of 67 slices shown]
[im 23/67  brain]
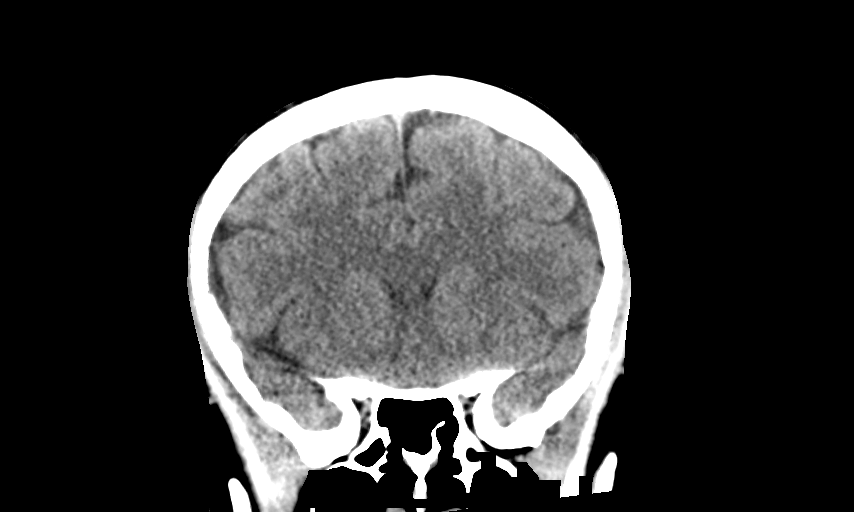
[im 30/67  brain]
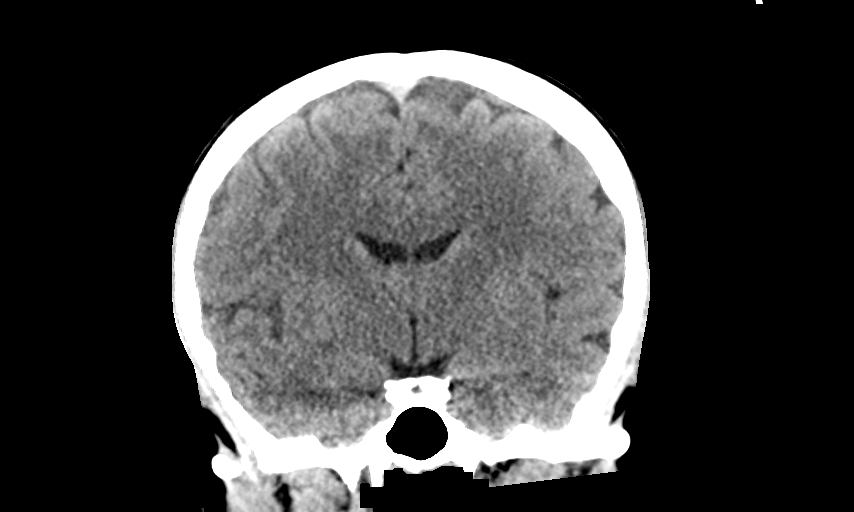
[im 37/67  brain]
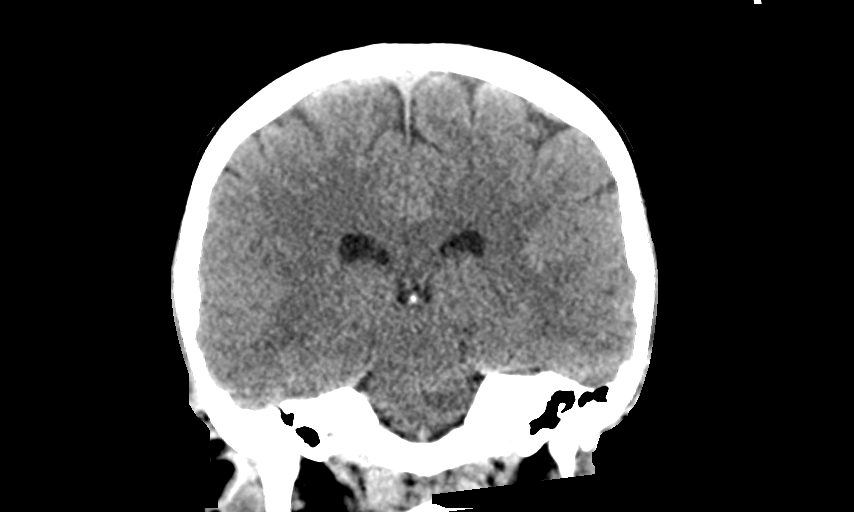

[Series 6: head without sag · sagittal · non-contrast · 0.32mm/px · 3 of 67 slices shown]
[im 23/67  brain]
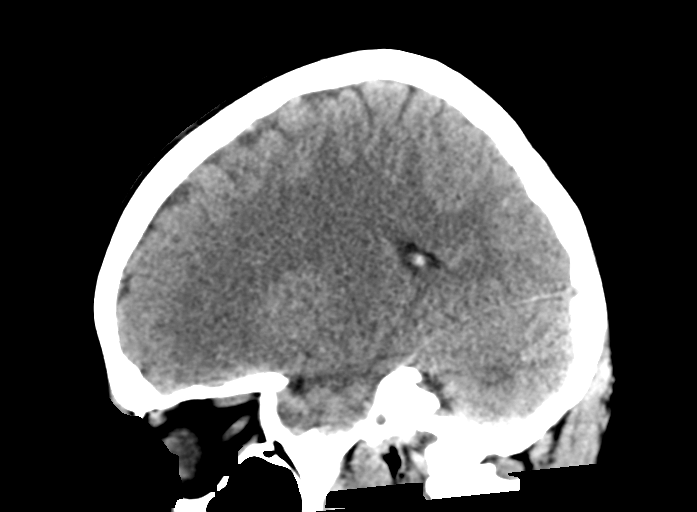
[im 34/67  brain]
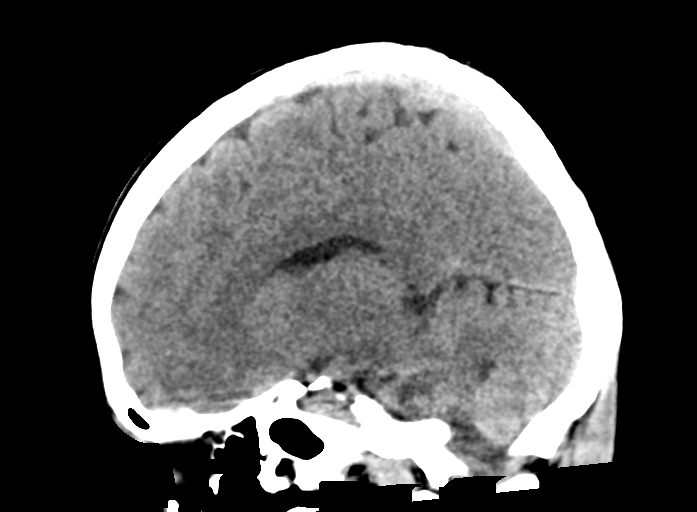
[im 45/67  brain]
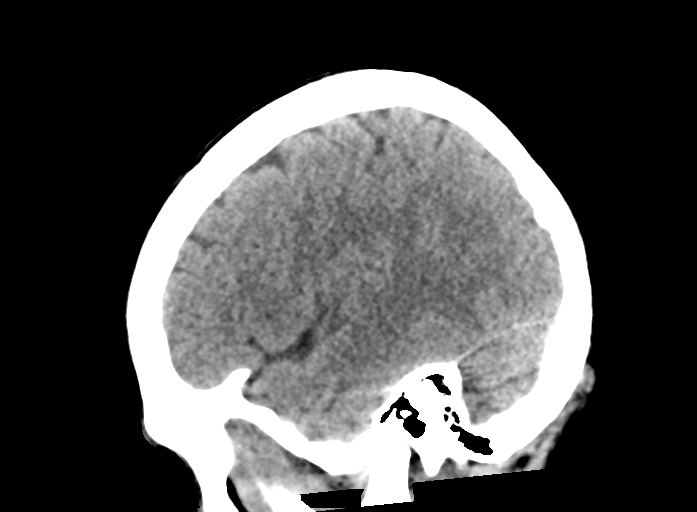

[15 of 47 positions shown; findings below may reference images not displayed]

FINDINGS: Brain: No evidence of acute infarction, hemorrhage, hydrocephalus,
extra-axial collection or mass lesion/mass effect.

Vascular: No hyperdense vessel or unexpected calcification.

Skull: Normal. Negative for fracture or focal lesion.

Sinuses/Orbits: No acute finding.

Other: None.
IMPRESSION: No acute intracranial abnormality noted.

## 2022-09-04 DIAGNOSIS — Z419 Encounter for procedure for purposes other than remedying health state, unspecified: Secondary | ICD-10-CM | POA: Diagnosis not present

## 2022-09-15 ENCOUNTER — Ambulatory Visit: Payer: Medicaid Other

## 2022-09-16 ENCOUNTER — Ambulatory Visit (HOSPITAL_BASED_OUTPATIENT_CLINIC_OR_DEPARTMENT_OTHER): Payer: Medicaid Other

## 2022-09-16 ENCOUNTER — Ambulatory Visit: Payer: Medicaid Other | Attending: Obstetrics and Gynecology | Admitting: *Deleted

## 2022-09-16 VITALS — BP 102/61 | HR 89

## 2022-09-16 DIAGNOSIS — Z362 Encounter for other antenatal screening follow-up: Secondary | ICD-10-CM

## 2022-09-16 DIAGNOSIS — Z818 Family history of other mental and behavioral disorders: Secondary | ICD-10-CM | POA: Diagnosis not present

## 2022-09-16 DIAGNOSIS — O99342 Other mental disorders complicating pregnancy, second trimester: Secondary | ICD-10-CM | POA: Diagnosis not present

## 2022-09-16 DIAGNOSIS — F840 Autistic disorder: Secondary | ICD-10-CM | POA: Diagnosis not present

## 2022-09-16 DIAGNOSIS — Z3A25 25 weeks gestation of pregnancy: Secondary | ICD-10-CM | POA: Insufficient documentation

## 2022-09-16 DIAGNOSIS — O09292 Supervision of pregnancy with other poor reproductive or obstetric history, second trimester: Secondary | ICD-10-CM

## 2022-10-05 DIAGNOSIS — Z419 Encounter for procedure for purposes other than remedying health state, unspecified: Secondary | ICD-10-CM | POA: Diagnosis not present

## 2022-10-06 DIAGNOSIS — Z3483 Encounter for supervision of other normal pregnancy, third trimester: Secondary | ICD-10-CM | POA: Diagnosis not present

## 2022-10-20 DIAGNOSIS — Z114 Encounter for screening for human immunodeficiency virus [HIV]: Secondary | ICD-10-CM | POA: Diagnosis not present

## 2022-10-20 DIAGNOSIS — Z8279 Family history of other congenital malformations, deformations and chromosomal abnormalities: Secondary | ICD-10-CM | POA: Diagnosis not present

## 2022-10-20 DIAGNOSIS — O09899 Supervision of other high risk pregnancies, unspecified trimester: Secondary | ICD-10-CM | POA: Diagnosis not present

## 2022-10-20 DIAGNOSIS — Z113 Encounter for screening for infections with a predominantly sexual mode of transmission: Secondary | ICD-10-CM | POA: Diagnosis not present

## 2022-10-20 DIAGNOSIS — Z3483 Encounter for supervision of other normal pregnancy, third trimester: Secondary | ICD-10-CM | POA: Diagnosis not present

## 2022-10-21 DIAGNOSIS — Z3483 Encounter for supervision of other normal pregnancy, third trimester: Secondary | ICD-10-CM | POA: Diagnosis not present

## 2022-10-22 ENCOUNTER — Encounter (HOSPITAL_BASED_OUTPATIENT_CLINIC_OR_DEPARTMENT_OTHER): Payer: Medicaid Other | Admitting: Advanced Practice Midwife

## 2022-10-23 ENCOUNTER — Other Ambulatory Visit (HOSPITAL_COMMUNITY)
Admission: RE | Admit: 2022-10-23 | Discharge: 2022-10-23 | Disposition: A | Discharge status: Home / Self Care | Payer: Medicaid Other | Source: Ambulatory Visit | Attending: Advanced Practice Midwife | Admitting: Advanced Practice Midwife

## 2022-10-23 ENCOUNTER — Ambulatory Visit (INDEPENDENT_AMBULATORY_CARE_PROVIDER_SITE_OTHER): Payer: Medicaid Other | Admitting: Advanced Practice Midwife

## 2022-10-23 ENCOUNTER — Encounter (HOSPITAL_BASED_OUTPATIENT_CLINIC_OR_DEPARTMENT_OTHER): Payer: Self-pay | Admitting: Advanced Practice Midwife

## 2022-10-23 VITALS — BP 116/79 | HR 86 | Wt 132.4 lb

## 2022-10-23 DIAGNOSIS — O26843 Uterine size-date discrepancy, third trimester: Secondary | ICD-10-CM

## 2022-10-23 DIAGNOSIS — Z3483 Encounter for supervision of other normal pregnancy, third trimester: Secondary | ICD-10-CM | POA: Insufficient documentation

## 2022-10-23 DIAGNOSIS — R768 Other specified abnormal immunological findings in serum: Secondary | ICD-10-CM | POA: Diagnosis not present

## 2022-10-23 DIAGNOSIS — Z3A31 31 weeks gestation of pregnancy: Secondary | ICD-10-CM | POA: Diagnosis not present

## 2022-10-23 DIAGNOSIS — R87612 Low grade squamous intraepithelial lesion on cytologic smear of cervix (LGSIL): Secondary | ICD-10-CM | POA: Diagnosis not present

## 2022-10-23 DIAGNOSIS — Z3490 Encounter for supervision of normal pregnancy, unspecified, unspecified trimester: Secondary | ICD-10-CM | POA: Insufficient documentation

## 2022-10-23 NOTE — Progress Notes (Signed)
Subjective:   Tammie Scott is a 22 y.o. G2P1001 at [redacted]w[redacted]d by early ultrasound being seen today for her first obstetrical visit.  Her obstetrical history is significant for  NSVD x 1  and has Generalized anxiety disorder; Supervision of normal pregnancy; Biological false positive RPR test; and LGSIL on Pap smear of cervix on their problem list.. Patient does intend to breast feed. Pregnancy history fully reviewed.  Patient reports no complaints.  HISTORY: OB History  Gravida Para Term Preterm AB Living  0 0 1  SAB IAB Ectopic Multiple Live Births  0 0 0 0 1    # Outcome Date GA Lbr Len/2nd Weight Sex Delivery Anes PTL Lv  2 Current           1 Term 10/20/21 [redacted]w[redacted]d 05:52 / 00:13 7 lb 1.9 oz (3.23 kg) F Vag-Spont EPI  LIV     Name: Tammie Scott     Apgar1: 8  Apgar5: 9   Past Medical History:  Diagnosis Date   COVID    Dysuria    MDD (major depressive disorder)    Medical history non-contributory    Mood changes    UTI (urinary tract infection)    Vasovagal syncope    Viral syndrome    Past Surgical History:  Procedure Laterality Date   EYE EXAMINATION UNDER ANESTHESIA Left    NO PAST SURGERIES     Family History  Problem Relation Age of Onset   Epilepsy Brother    Asthma Brother    Diabetes Maternal Grandfather    Social History   Tobacco Use   Smoking status: Former    Types: Cigarettes    Quit date: 09/2020    Years since quitting: 2.1   Smokeless tobacco: Never  Vaping Use   Vaping Use: Former   Quit date: 02/10/2021  Substance Use Topics   Alcohol use: Never   Drug use: Not Currently    Types: Marijuana    Comment: Pt hasn't smoked in years   No Known Allergies Current Outpatient Medications on File Prior to Visit  Medication Sig Dispense Refill   norethindrone (MICRONOR) 0.35 MG tablet Take 1 tablet (0.35 mg total) by mouth daily. (Patient not taking: Reported on 04/03/2022) 84 tablet 3   Prenatal Vit-Fe Fumarate-FA  (MULTIVITAMIN-PRENATAL) 27-0.8 MG TABS tablet Take 1 tablet by mouth daily at 12 noon.     No current facility-administered medications on file prior to visit.    Exam   Vitals:   10/23/22 1239  BP: 116/79  Pulse: 86  Weight: 132 lb 6.4 oz (60.1 kg)   Fetal Heart Rate (bpm): 145  VS reviewed, nursing note reviewed,  Constitutional: well developed, well nourished, no distress HEENT: normocephalic CV: normal rate Pulm/chest wall: normal effort Abdomen: soft Neuro: alert and oriented x 3 Skin: warm, dry Psych: affect normal    Assessment:   Pregnancy: G2P1001 Patient Active Problem List   Diagnosis Date Noted   Supervision of normal pregnancy 10/23/2022   Biological false positive RPR test 10/23/2022   LGSIL on Pap smear of cervix 10/23/2022   Generalized anxiety disorder 05/01/2016     Plan:  1. Uterine size date discrepancy pregnancy, third trimester --FH 29 today at 31 weeks, previous US EFW 31% on 09/16/22 - [place] MFM OB FOLLOW UP; Future  2. Encounter for supervision of other normal pregnancy in third trimester --Anticipatory guidance about next visits/weeks of pregnancy given.   - Cervicovaginal  ancillary only( [place])  3. [redacted] weeks gestation of pregnancy   4. Biological false positive RPR test --Titer 1:8, TPAL neg  5. LGSIL on Pap smear of cervix --12/02/21, [place] 04/03/22 wnl, repeat Pap 1 one year    Initial labs reviewed. Continue prenatal vitamins. NIPS: results reviewed. Ultrasound discussed; fetal anatomic survey: results reviewed. Problem list reviewed and updated. The nature of [place] [facility] Faculty Practice with multiple MDs and other Advanced Practice Providers was explained to patient; also emphasized that residents, students are part of our team. Routine obstetric precautions reviewed. Return in about 2 weeks (around 11/06/2022) for As scheduled.   Sharen Counter, CNM 10/23/22 12:56 PM

## 2022-10-23 NOTE — Progress Notes (Signed)
   PRENATAL VISIT NOTE  Subjective:  Tammie Scott is a 22 y.o. G2P1001 at [redacted]w[redacted]d being seen today for ongoing prenatal care.  She is currently monitored for the following issues for this low-risk pregnancy and has Generalized anxiety disorder on their problem list.  Patient reports no complaints.   Vag. Bleeding: None.  Movement: Present. Denies leaking of fluid.   The following portions of the patient's history were reviewed and updated as appropriate: allergies, current medications, past family history, past medical history, past social history, past surgical history and problem list.   Objective:   Vitals:   10/23/22 1239  BP: 116/79  Pulse: 86  Weight: 132 lb 6.4 oz (60.1 kg)    Fetal Status: Fetal Heart Rate (bpm): 145 Fundal Height: 29 cm Movement: Present     General:  Alert, oriented and cooperative. Patient is in no acute distress.  Skin: Skin is warm and dry. No rash noted.   Cardiovascular: Normal heart rate noted  Respiratory: Normal respiratory effort, no problems with respiration noted  Abdomen: Soft, gravid, appropriate for gestational age.  Pain/Pressure: Absent     Pelvic: Cervical exam deferred        Extremities: Normal range of motion.  Edema: Trace  Mental Status: Normal mood and affect. Normal behavior. Normal judgment and thought content.   Assessment and Plan:  Pregnancy: G2P1001 at [redacted]w[redacted]d    Preterm labor symptoms and general obstetric precautions including but not limited to vaginal bleeding, contractions, leaking of fluid and fetal movement were reviewed in detail with the patient. Please refer to After Visit Summary for other counseling recommendations.   Return in about 2 weeks (around 11/06/2022) for As scheduled.  Future Appointments  Date Time Provider Department Center  11/10/2022 10:35 AM Jerene Bears, MD [facility]  11/25/2022  1:15 PM [facility] NURSE [facility]  11/25/2022  1:30 PM [facility] US2 [facility]  11/27/2022 11:15 AM  Marny Lowenstein, PA-C [facility]    Sharen Counter, CNM

## 2022-10-26 LAB — CERVICOVAGINAL ANCILLARY ONLY
Chlamydia: NEGATIVE
Comment: NEGATIVE
Comment: NORMAL
Neisseria Gonorrhea: NEGATIVE

## 2022-11-04 DIAGNOSIS — Z419 Encounter for procedure for purposes other than remedying health state, unspecified: Secondary | ICD-10-CM | POA: Diagnosis not present

## 2022-11-10 ENCOUNTER — Ambulatory Visit (INDEPENDENT_AMBULATORY_CARE_PROVIDER_SITE_OTHER): Payer: 59 | Admitting: Obstetrics & Gynecology

## 2022-11-10 VITALS — BP 108/64 | HR 77 | Wt 135.6 lb

## 2022-11-10 DIAGNOSIS — Z3A33 33 weeks gestation of pregnancy: Secondary | ICD-10-CM | POA: Diagnosis not present

## 2022-11-10 DIAGNOSIS — Z3483 Encounter for supervision of other normal pregnancy, third trimester: Secondary | ICD-10-CM

## 2022-11-10 DIAGNOSIS — R768 Other specified abnormal immunological findings in serum: Secondary | ICD-10-CM

## 2022-11-10 DIAGNOSIS — R87612 Low grade squamous intraepithelial lesion on cytologic smear of cervix (LGSIL): Secondary | ICD-10-CM

## 2022-11-10 DIAGNOSIS — O26843 Uterine size-date discrepancy, third trimester: Secondary | ICD-10-CM | POA: Diagnosis not present

## 2022-11-10 DIAGNOSIS — F5089 Other specified eating disorder: Secondary | ICD-10-CM

## 2022-11-10 DIAGNOSIS — F411 Generalized anxiety disorder: Secondary | ICD-10-CM

## 2022-11-10 DIAGNOSIS — Z23 Encounter for immunization: Secondary | ICD-10-CM

## 2022-11-10 NOTE — Progress Notes (Signed)
   PRENATAL VISIT NOTE  Subjective:  Tammie Scott is a 22 y.o. G2P1001 at [redacted]w[redacted]d being seen today for ongoing prenatal care.  She is currently monitored for the following issues for this low-risk pregnancy and has Generalized anxiety disorder; Supervision of normal pregnancy; Biological false positive RPR test; LGSIL on Pap smear of cervix; and History of major depression on their problem list.  Patient reports no complaints.  Contractions: Not present. Vag. Bleeding: None.  Movement: Present. Denies leaking of fluid.   The following portions of the patient's history were reviewed and updated as appropriate: allergies, current medications, past family history, past medical history, past social history, past surgical history and problem list.   Objective:   Vitals:   11/10/22 1048  BP: 108/64  Pulse: 77  Weight: 135 lb 9.6 oz (61.5 kg)    Fetal Status: Fetal Heart Rate (bpm): 125 Fundal Height: 31 cm Movement: Present     General:  Alert, oriented and cooperative. Patient is in no acute distress.  Skin: Skin is warm and dry. No rash noted.   Cardiovascular: Normal heart rate noted  Respiratory: Normal respiratory effort, no problems with respiration noted  Abdomen: Soft, gravid, appropriate for gestational age.  Pain/Pressure: Absent     Pelvic: Cervical exam deferred        Extremities: Normal range of motion.  Edema: None  Mental Status: Normal mood and affect. Normal behavior. Normal judgment and thought content.   Assessment and Plan:  Pregnancy: G2P1001 at [redacted]w[redacted]d 1. [redacted] weeks gestation of pregnancy - on PNV - recheck 2 weeks  2. Encounter for supervision of other normal pregnancy in third trimester  3. [place] - CBC - Iron, TIBC and Ferritin Panel  4. Biological false positive RPR test  5. LGSIL on Pap smear of cervix  6. Generalized anxiety disorder   Preterm labor symptoms and general obstetric precautions including but not limited to vaginal bleeding,  contractions, leaking of fluid and fetal movement were reviewed in detail with the patient. Please refer to After Visit Summary for other counseling recommendations.   Return in about 2 weeks (around 11/24/2022).  Future Appointments  Date Time Provider Department Center  11/25/2022  1:15 PM [facility] NURSE [facility]  11/25/2022  1:30 PM [facility] US2 [facility]  11/27/2022 11:15 AM Marny Lowenstein, PA-C [facility]  12/04/2022 10:35 AM Jerene Bears, MD [facility]  12/11/2022 10:55 AM Jerene Bears, MD [facility]  12/18/2022 11:35 AM Jerene Bears, MD [facility]  12/25/2022 10:55 AM Jerene Bears, MD [facility]    Jerene Bears, MD

## 2022-11-11 ENCOUNTER — Other Ambulatory Visit (HOSPITAL_BASED_OUTPATIENT_CLINIC_OR_DEPARTMENT_OTHER): Payer: Self-pay | Admitting: Obstetrics & Gynecology

## 2022-11-11 ENCOUNTER — Other Ambulatory Visit: Payer: Self-pay | Admitting: Pharmacy Technician

## 2022-11-11 ENCOUNTER — Telehealth: Payer: Self-pay | Admitting: Pharmacy Technician

## 2022-11-11 DIAGNOSIS — E611 Iron deficiency: Secondary | ICD-10-CM | POA: Insufficient documentation

## 2022-11-11 DIAGNOSIS — F5089 Other specified eating disorder: Secondary | ICD-10-CM | POA: Insufficient documentation

## 2022-11-11 LAB — IRON,TIBC AND FERRITIN PANEL
Ferritin: 8 ng/mL — ABNORMAL LOW (ref 15–150)
Iron Saturation: 5 % — CL (ref 15–55)
Iron: 29 ug/dL (ref 27–159)
Total Iron Binding Capacity: 600 ug/dL (ref 250–450)
UIBC: 571 ug/dL — ABNORMAL HIGH (ref 131–425)

## 2022-11-11 LAB — CBC
Hematocrit: 35.6 % (ref 34.0–46.6)
Hemoglobin: 11 g/dL — ABNORMAL LOW (ref 11.1–15.9)
MCH: 25.1 pg — ABNORMAL LOW (ref 26.6–33.0)
MCHC: 30.9 g/dL — ABNORMAL LOW (ref 31.5–35.7)
MCV: 81 fL (ref 79–97)
Platelets: 158 10*3/uL (ref 150–450)
RBC: 4.39 x10E6/uL (ref 3.77–5.28)
RDW: 13.4 % (ref 11.7–15.4)
WBC: 6.7 10*3/uL (ref 3.4–10.8)

## 2022-11-11 NOTE — Telephone Encounter (Signed)
Dr. Hyacinth Meeker, [place] Childes note:  Patient will be scheduled as soon as possible.  Auth Submission: NO AUTH NEEDED Site of care: Site of care: [facility] Payer: [facility] Medication & CPT/J Code(s) submitted: Venofer (Iron Sucrose) J1756 Route of submission (phone, fax, portal):  Phone # Fax # Auth type: Buy/Bill Units/visits requested: 3 Reference number:  Approval from: 11/11/22 to 03/14/23

## 2022-11-18 IMAGING — US US MFM OB DETAIL+14 WK
1 series · 13 of 28 positions shown · non-contrast
Comparison: none

[Series 1: us mfm ob detail+14 wk · 106 acquisitions, 13 frames shown]
[im 4/106]
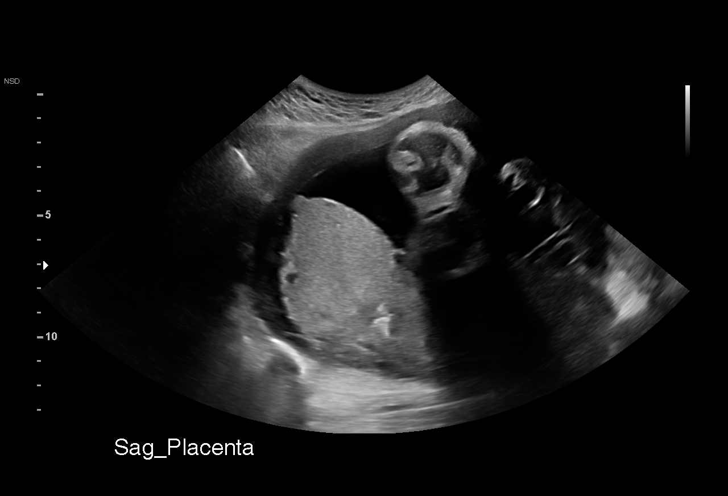
[im 12/106]
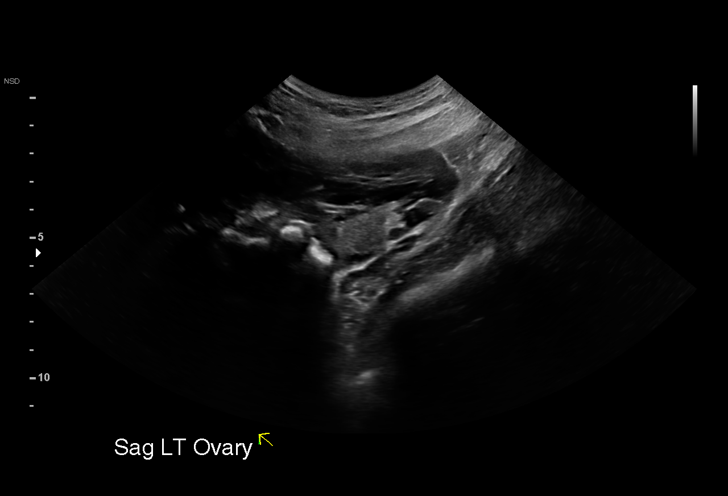
[im 20/106]
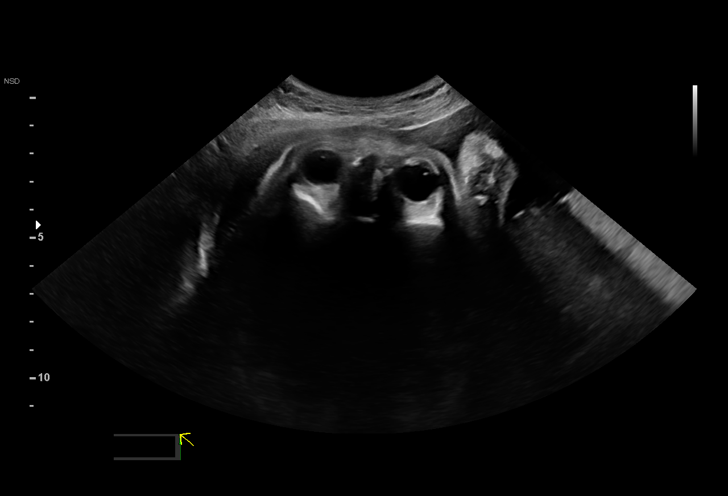
[im 28/106]
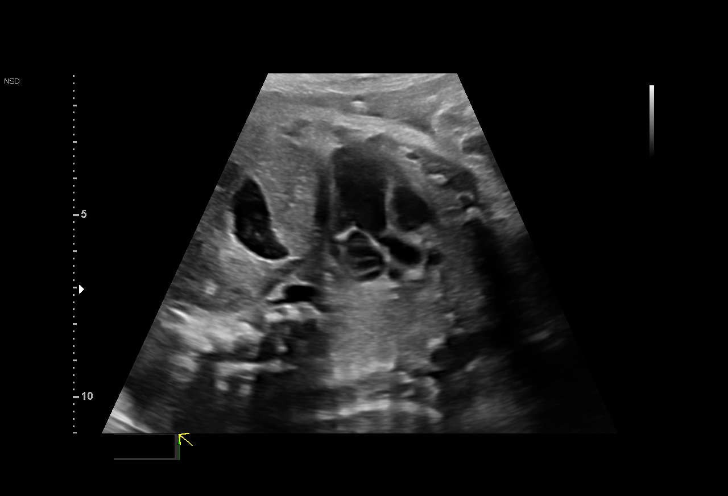
[im 36/106]
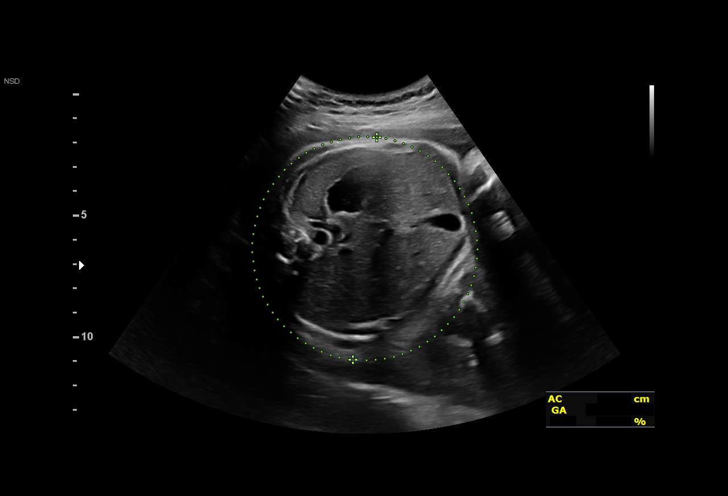
[im 43/106]
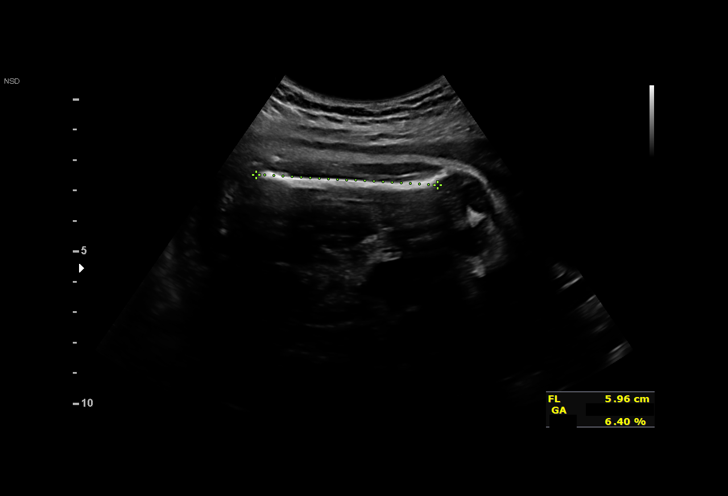
[im 55/106]
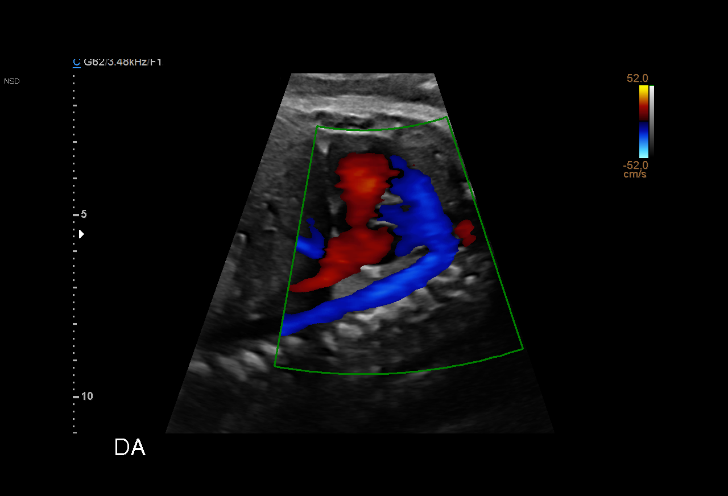
[im 63/106]
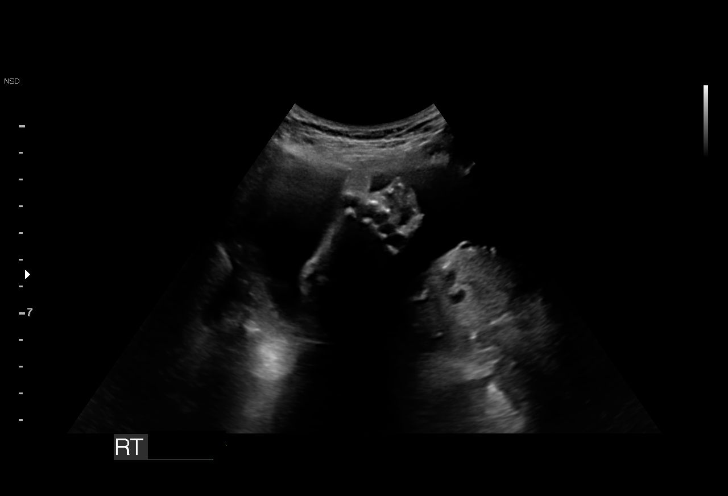
[im 71/106]
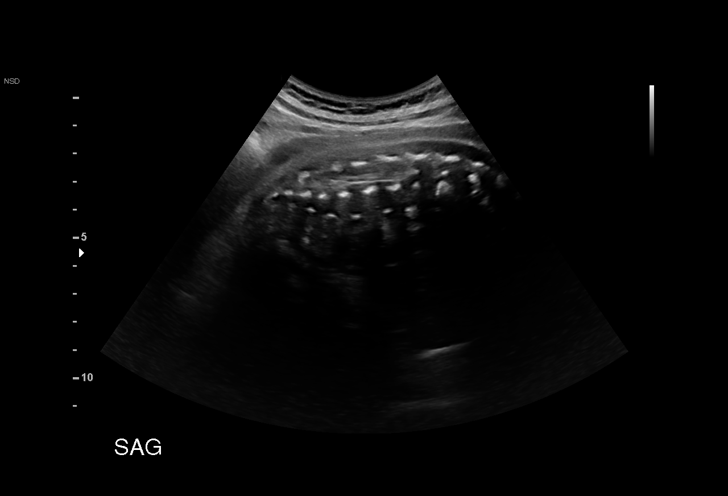
[im 78/106]
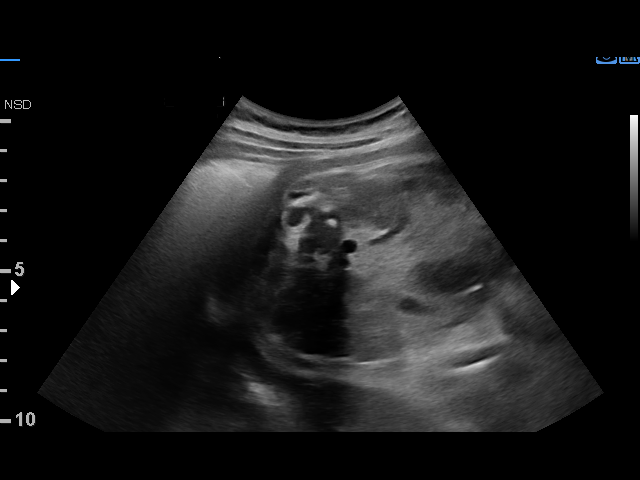
[im 86/106]
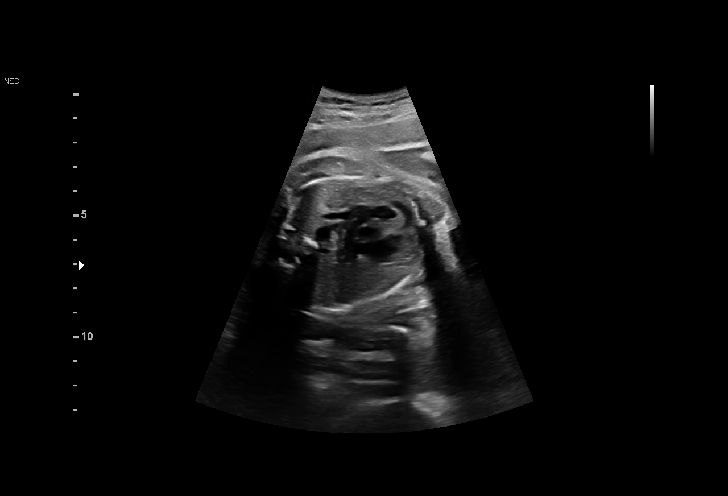
[im 94/106]
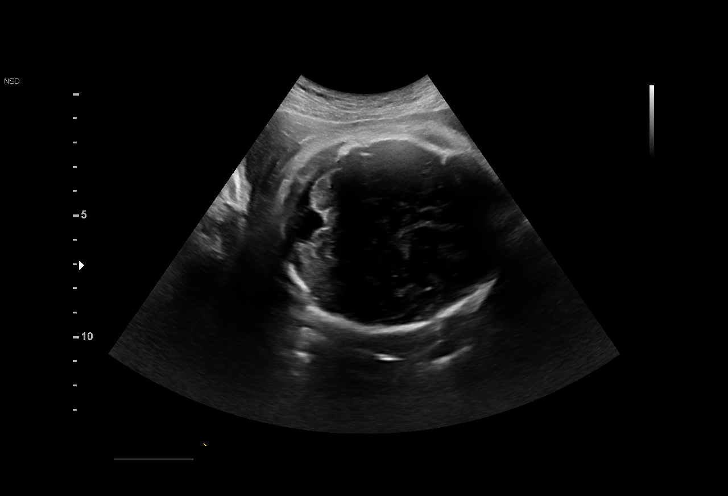
[im 102/106]
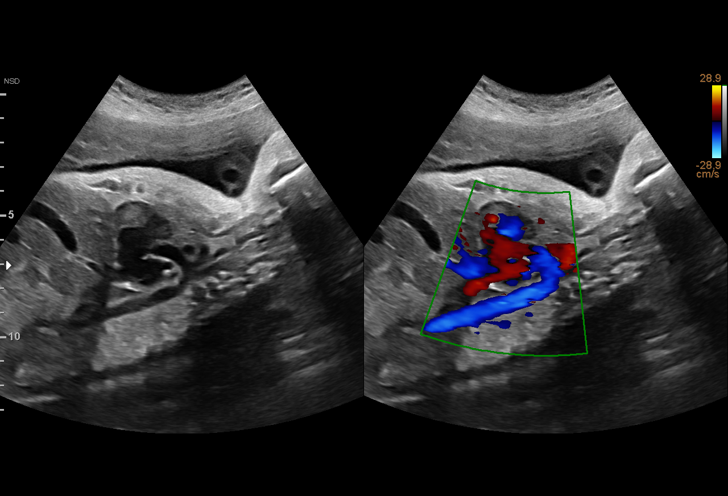

[13 of 28 positions shown; findings below may reference images not displayed]

Indications

 Insufficient Prenatal Care
 32 weeks gestation of pregnancy
 Encounter for antenatal screening for
 malformations
Fetal Evaluation

 Num Of Fetuses:         1
 Fetal Heart Rate(bpm):  147
 Cardiac Activity:       Observed
 Presentation:           Cephalic
 Placenta:               Posterior
 P. Cord Insertion:      Visualized

 Amniotic Fluid
 AFI FV:      Within normal limits

 AFI Sum(cm)     %Tile       Largest Pocket(cm)
 14.4            50

 RUQ(cm)       RLQ(cm)       LUQ(cm)        LLQ(cm)

Biometry

 BPD:      84.6  mm     G. Age:  34w 1d         81  %    CI:        74.29   %    70 - 86
                                                         FL/HC:      19.0   %    19.9 -
 HC:      311.6  mm     G. Age:  34w 6d         70  %    HC/AC:      1.10        0.96 -
 AC:      283.7  mm     G. Age:  32w 3d         41  %    FL/BPD:     70.1   %    71 - 87
 FL:       59.3  mm     G. Age:  30w 6d          5  %    FL/AC:      20.9   %    20 - 24
 HUM:      51.8  mm     G. Age:  30w 2d          6  %

 Est. FW:    2934  gm      4 lb 5 oz     29  %
OB History

 Gravidity:    1
Gestational Age

 LMP:           32w 5d        Date:  01/17/21                 EDD:   10/24/21
 U/S Today:     33w 1d                                        EDD:   10/21/21
 Best:          32w 5d     Det. By:  LMP  (01/17/21)          EDD:   10/24/21
Anatomy

 Cranium:               Appears normal         LVOT:                   Appears normal
 Cavum:                 Not well visualized    Aortic Arch:            Appears normal
 Ventricles:            Appears normal         Ductal Arch:            Appears normal
 Choroid Plexus:        Appears normal         Diaphragm:              Appears normal
 Cerebellum:            Not well visualized    Stomach:                Appears normal, left
                                                                       sided
 Posterior Fossa:       Not well visualized    Abdomen:                Appears normal
 Nuchal Fold:           Not applicable (>20    Abdominal Wall:         Appears nml (cord
                        wks GA)                                        insert, abd wall)
 Face:                  Orbits appear          Cord Vessels:           Appears normal (3
                        normal                                         vessel cord)
 Lips:                  Appears normal         Kidneys:                Appear normal
 Palate:                Not well visualized    Bladder:                Appears normal
 Thoracic:              Appears normal         Spine:                  Limited views
                                                                       appear normal
 Heart:                 Appears normal         Upper Extremities:      Visualized
                        (4CH, axis, and
                        situs)
 RVOT:                  Appears normal         Lower Extremities:      Visualized

 Other:  Fetus appears to be female. Technically difficult due to advanced GA
         and fetal position.
Cervix Uterus Adnexa

 Cervix
 Not visualized (advanced GA >93wks)

 Right Ovary
 Within normal limits.

 Left Ovary
 Within normal limits.

 Adnexa
 No abnormality visualized.
Impression

 Single intrauterine pregnancy here for a detailed anatomy
 due late to prenatal care.
 Normal anatomy with measurements consistent with dates
 There is good fetal movement and amniotic fluid volume
 Suboptimal views of the fetal anatomy were obtained
 secondary to fetal position.

 In addition we discussed starting daily low dose ASA for the
 prevention of preeclampsia.
Recommendations

 Follow up growth in 4 weeks.

## 2022-11-24 ENCOUNTER — Encounter: Payer: Self-pay | Admitting: Obstetrics & Gynecology

## 2022-11-25 ENCOUNTER — Ambulatory Visit: Payer: 59 | Admitting: *Deleted

## 2022-11-25 ENCOUNTER — Ambulatory Visit: Payer: 59 | Attending: Advanced Practice Midwife

## 2022-11-25 VITALS — BP 102/53 | HR 69

## 2022-11-25 DIAGNOSIS — O358XX0 Maternal care for other (suspected) fetal abnormality and damage, not applicable or unspecified: Secondary | ICD-10-CM | POA: Diagnosis not present

## 2022-11-25 DIAGNOSIS — O09899 Supervision of other high risk pregnancies, unspecified trimester: Secondary | ICD-10-CM | POA: Insufficient documentation

## 2022-11-25 DIAGNOSIS — Z3A35 35 weeks gestation of pregnancy: Secondary | ICD-10-CM

## 2022-11-25 DIAGNOSIS — O26843 Uterine size-date discrepancy, third trimester: Secondary | ICD-10-CM | POA: Insufficient documentation

## 2022-11-26 ENCOUNTER — Ambulatory Visit (INDEPENDENT_AMBULATORY_CARE_PROVIDER_SITE_OTHER): Payer: 59

## 2022-11-26 VITALS — BP 106/70 | HR 84 | Temp 98.1°F | Resp 18 | Ht 62.0 in | Wt 137.2 lb

## 2022-11-26 DIAGNOSIS — F5089 Other specified eating disorder: Secondary | ICD-10-CM

## 2022-11-26 DIAGNOSIS — E611 Iron deficiency: Secondary | ICD-10-CM

## 2022-11-26 MED ORDER — DIPHENHYDRAMINE HCL 25 MG PO CAPS
25.0000 mg | ORAL_CAPSULE | Freq: Once | ORAL | Status: AC
  Administered 2022-11-26: 25 mg via ORAL
  Filled 2022-11-26: qty 1

## 2022-11-26 MED ORDER — ACETAMINOPHEN 325 MG PO TABS
650.0000 mg | ORAL_TABLET | Freq: Once | ORAL | Status: AC
  Administered 2022-11-26: 650 mg via ORAL
  Filled 2022-11-26: qty 2

## 2022-11-26 MED ORDER — SODIUM CHLORIDE 0.9 % IV SOLN
200.0000 mg | Freq: Once | INTRAVENOUS | Status: AC
  Administered 2022-11-26: 200 mg via INTRAVENOUS
  Filled 2022-11-26: qty 10

## 2022-11-26 NOTE — Patient Instructions (Signed)

## 2022-11-26 NOTE — Progress Notes (Signed)
Diagnosis: Iron Deficiency Anemia  Provider:  Chilton Greathouse MD  Procedure: IV Infusion  IV Type: Peripheral, IV Location: L Forearm  Venofer (Iron Sucrose), Dose: 200 mg  Infusion Start Time: 1514  Infusion Stop Time: 1536  Post Infusion IV Care: Observation period completed and Peripheral IV Discontinued  Discharge: Condition: Good, Destination: Home . AVS Provided  Performed by:  Adriana Mccallum, RN

## 2022-11-27 ENCOUNTER — Ambulatory Visit (INDEPENDENT_AMBULATORY_CARE_PROVIDER_SITE_OTHER): Payer: Medicaid Other | Admitting: Medical

## 2022-11-27 ENCOUNTER — Other Ambulatory Visit (HOSPITAL_COMMUNITY)
Admission: RE | Admit: 2022-11-27 | Discharge: 2022-11-27 | Disposition: A | Discharge status: Home / Self Care | Payer: 59 | Source: Ambulatory Visit | Attending: Medical | Admitting: Medical

## 2022-11-27 VITALS — BP 118/73 | HR 87 | Wt 136.4 lb

## 2022-11-27 DIAGNOSIS — Z3A36 36 weeks gestation of pregnancy: Secondary | ICD-10-CM

## 2022-11-27 DIAGNOSIS — E611 Iron deficiency: Secondary | ICD-10-CM

## 2022-11-27 DIAGNOSIS — Z8659 Personal history of other mental and behavioral disorders: Secondary | ICD-10-CM

## 2022-11-27 DIAGNOSIS — R768 Other specified abnormal immunological findings in serum: Secondary | ICD-10-CM

## 2022-11-27 DIAGNOSIS — F411 Generalized anxiety disorder: Secondary | ICD-10-CM

## 2022-11-27 DIAGNOSIS — R87612 Low grade squamous intraepithelial lesion on cytologic smear of cervix (LGSIL): Secondary | ICD-10-CM

## 2022-11-27 DIAGNOSIS — F5089 Other specified eating disorder: Secondary | ICD-10-CM

## 2022-11-27 DIAGNOSIS — Z3483 Encounter for supervision of other normal pregnancy, third trimester: Secondary | ICD-10-CM | POA: Insufficient documentation

## 2022-11-27 NOTE — Progress Notes (Signed)
   PRENATAL VISIT NOTE  Subjective:  Tammie Scott is a 22 y.o. G2P1001 at [redacted]w[redacted]d being seen today for ongoing prenatal care.  She is currently monitored for the following issues for this low-risk pregnancy and has Generalized anxiety disorder; Supervision of normal pregnancy; Biological false positive RPR test; LGSIL on Pap smear of cervix; History of major depression; Iron deficiency; and Pica on their problem list.  Patient reports occasional contractions.  Contractions: Irregular. Vag. Bleeding: None.  Movement: Present. Denies leaking of fluid.   The following portions of the patient's history were reviewed and updated as appropriate: allergies, current medications, past family history, past medical history, past social history, past surgical history and problem list.   Objective:   Vitals:   11/27/22 1136  BP: 118/73  Pulse: 87  Weight: 136 lb 6.4 oz (61.9 kg)    Fetal Status: Fetal Heart Rate (bpm): 135 Fundal Height: 34 cm Movement: Present  Presentation: Vertex  General:  Alert, oriented and cooperative. Patient is in no acute distress.  Skin: Skin is warm and dry. No rash noted.   Cardiovascular: Normal heart rate noted  Respiratory: Normal respiratory effort, no problems with respiration noted  Abdomen: Soft, gravid, appropriate for gestational age.  Pain/Pressure: Absent     Pelvic: Cervical exam performed in the presence of a chaperone Dilation: 2 Effacement (%): 50 Station: -1  Extremities: Normal range of motion.  Edema: None  Mental Status: Normal mood and affect. Normal behavior. Normal judgment and thought content.   Assessment and Plan:  Pregnancy: G2P1001 at [redacted]w[redacted]d 1. Encounter for supervision of other normal pregnancy in third trimester - Cervicovaginal ancillary only( [place]) - Culture, beta strep (group b only) - Still unsure about MOC, no questions today   2. Iron deficiency  3. Biological false positive RPR test  4. Generalized anxiety  disorder  5. LGSIL on Pap smear of cervix  6. Pica  7. History of major depression  8. [redacted] weeks gestation of pregnancy   Preterm labor symptoms and general obstetric precautions including but not limited to vaginal bleeding, contractions, leaking of fluid and fetal movement were reviewed in detail with the patient. Please refer to After Visit Summary for other counseling recommendations.   No follow-ups on file.  Future Appointments  Date Time Provider Department Center  12/03/2022  2:15 PM [facility] None  12/04/2022  9:55 AM Jerene Bears, MD [facility]  12/10/2022  2:15 PM [facility] None  12/11/2022 10:55 AM Jerene Bears, MD [facility]  12/18/2022 11:35 AM Jerene Bears, MD [facility]  12/25/2022 10:55 AM Jerene Bears, MD [facility]    Vonzella Nipple, PA-C

## 2022-12-01 LAB — CERVICOVAGINAL ANCILLARY ONLY
Chlamydia: NEGATIVE
Comment: NEGATIVE
Comment: NORMAL
Neisseria Gonorrhea: NEGATIVE

## 2022-12-01 LAB — CULTURE, BETA STREP (GROUP B ONLY): Strep Gp B Culture: NEGATIVE

## 2022-12-03 ENCOUNTER — Ambulatory Visit (INDEPENDENT_AMBULATORY_CARE_PROVIDER_SITE_OTHER): Payer: 59

## 2022-12-03 VITALS — BP 103/65 | HR 102 | Temp 98.1°F | Resp 16 | Ht 62.0 in | Wt 139.2 lb

## 2022-12-03 DIAGNOSIS — E611 Iron deficiency: Secondary | ICD-10-CM

## 2022-12-03 DIAGNOSIS — F5089 Other specified eating disorder: Secondary | ICD-10-CM

## 2022-12-03 MED ORDER — DIPHENHYDRAMINE HCL 25 MG PO CAPS: 25.0000 mg | ORAL_CAPSULE | Freq: Once | ORAL | Status: DC

## 2022-12-03 MED ORDER — ACETAMINOPHEN 325 MG PO TABS: 650.0000 mg | ORAL_TABLET | Freq: Once | ORAL | Status: DC

## 2022-12-03 MED ORDER — SODIUM CHLORIDE 0.9 % IV SOLN
200.0000 mg | Freq: Once | INTRAVENOUS | Status: AC
  Administered 2022-12-03: 200 mg via INTRAVENOUS
  Filled 2022-12-03: qty 10

## 2022-12-03 NOTE — Progress Notes (Signed)
Diagnosis: Iron Deficiency Anemia  Provider:  Chilton Greathouse MD  Procedure: IV Infusion  IV Type: Peripheral, IV Location: L Antecubital  Venofer (Iron Sucrose), Dose: 200 mg  Infusion Start Time: 1435  Infusion Stop Time: 1451  Post Infusion IV Care: Peripheral IV Discontinued  Discharge: Condition: Good, Destination: Home . AVS Declined  Performed by:  Garnette Czech, RN

## 2022-12-04 ENCOUNTER — Ambulatory Visit (INDEPENDENT_AMBULATORY_CARE_PROVIDER_SITE_OTHER): Payer: Medicaid Other | Admitting: Obstetrics & Gynecology

## 2022-12-04 VITALS — BP 109/78 | HR 75 | Wt 137.0 lb

## 2022-12-04 DIAGNOSIS — Z3A37 37 weeks gestation of pregnancy: Secondary | ICD-10-CM

## 2022-12-04 DIAGNOSIS — R87612 Low grade squamous intraepithelial lesion on cytologic smear of cervix (LGSIL): Secondary | ICD-10-CM

## 2022-12-04 DIAGNOSIS — F411 Generalized anxiety disorder: Secondary | ICD-10-CM

## 2022-12-04 DIAGNOSIS — F5089 Other specified eating disorder: Secondary | ICD-10-CM

## 2022-12-04 DIAGNOSIS — R768 Other specified abnormal immunological findings in serum: Secondary | ICD-10-CM

## 2022-12-04 DIAGNOSIS — Z3483 Encounter for supervision of other normal pregnancy, third trimester: Secondary | ICD-10-CM

## 2022-12-04 NOTE — Progress Notes (Signed)
   PRENATAL VISIT NOTE  Subjective:  Tammie Scott is a 22 y.o. G2P1001 at [redacted]w[redacted]d being seen today for ongoing prenatal care.  She is currently monitored for the following issues for this low-risk pregnancy and has Generalized anxiety disorder; Supervision of normal pregnancy; Biological false positive RPR test; LGSIL on Pap smear of cervix; History of major depression; Iron deficiency; and Pica on their problem list.  Patient reports no complaints.  Contractions: Regular. Vag. Bleeding: None.  Movement: Present. Denies leaking of fluid.   The following portions of the patient's history were reviewed and updated as appropriate: allergies, current medications, past family history, past medical history, past social history, past surgical history and problem list.   Objective:   Vitals:   12/04/22 1008  BP: 109/78  Pulse: 75  Weight: 137 lb (62.1 kg)    Fetal Status: Fetal Heart Rate (bpm): 127   Movement: Present     General:  Alert, oriented and cooperative. Patient is in no acute distress.  Skin: Skin is warm and dry. No rash noted.   Cardiovascular: Normal heart rate noted  Respiratory: Normal respiratory effort, no problems with respiration noted  Abdomen: Soft, gravid, appropriate for gestational age.  Pain/Pressure: Present     Pelvic: Cervical exam performed in the presence of a chaperone        Extremities: Normal range of motion.  Edema: None  Mental Status: Normal mood and affect. Normal behavior. Normal judgment and thought content.   Assessment and Plan:  Pregnancy: G2P1001 at [redacted]w[redacted]d 1. Encounter for supervision of other normal pregnancy in third trimester - on PNV  2. [redacted] weeks gestation of pregnancy - recheck 1 weeks  3. Biological false positive RPR test - treponemal pallidum testing at [facility] not done.  Ordered today.  4. LGSIL on Pap smear of cervix - will repeat PP  5. Pica - hb was 11.0  6. Generalized anxiety disorder  Term labor symptoms and  general obstetric precautions including but not limited to vaginal bleeding, contractions, leaking of fluid and fetal movement were reviewed in detail with the patient. Please refer to After Visit Summary for other counseling recommendations.   Return in about 1 week (around 12/11/2022).  Future Appointments  Date Time Provider Department Center  12/10/2022  2:15 PM [facility] None  12/11/2022 10:55 AM Jerene Bears, MD [facility]  12/18/2022 11:35 AM Jerene Bears, MD [facility]  12/25/2022 10:55 AM Jerene Bears, MD [facility]    Jerene Bears, MD

## 2022-12-04 NOTE — Progress Notes (Deleted)
   PRENATAL VISIT NOTE  Subjective:  Tammie Scott is a 22 y.o. G2P1001 at [redacted]w[redacted]d being seen today for ongoing prenatal care.  She is currently monitored for the following issues for this {Blank single:19197::"high-risk","low-risk"} pregnancy and has Generalized anxiety disorder; Supervision of normal pregnancy; Biological false positive RPR test; LGSIL on Pap smear of cervix; History of major depression; Iron deficiency; and Pica on their problem list.  Patient reports {sx:[id number]}.  Contractions: Regular. Vag. Bleeding: None.  Movement: Present. Denies leaking of fluid.   The following portions of the patient's history were reviewed and updated as appropriate: allergies, current medications, past family history, past medical history, past social history, past surgical history and problem list.   Objective:   Vitals:   12/04/22 1008  BP: 109/78  Pulse: 75  Weight: 137 lb (62.1 kg)    Fetal Status: Fetal Heart Rate (bpm): 127   Movement: Present     General:  Alert, oriented and cooperative. Patient is in no acute distress.  Skin: Skin is warm and dry. No rash noted.   Cardiovascular: Normal heart rate noted  Respiratory: Normal respiratory effort, no problems with respiration noted  Abdomen: Soft, gravid, appropriate for gestational age.  Pain/Pressure: Present     Pelvic: {Blank single:19197::"Cervical exam performed in the presence of a chaperone","Cervical exam deferred"}        Extremities: Normal range of motion.  Edema: None  Mental Status: Normal mood and affect. Normal behavior. Normal judgment and thought content.   Assessment and Plan:  Pregnancy: G2P1001 at [redacted]w[redacted]d There are no diagnoses linked to this encounter. {Blank single:19197::"Term","Preterm"} labor symptoms and general obstetric precautions including but not limited to vaginal bleeding, contractions, leaking of fluid and fetal movement were reviewed in detail with the patient. Please refer to After Visit  Summary for other counseling recommendations.   No follow-ups on file.  Future Appointments  Date Time Provider Department Center  12/10/2022  2:15 PM [facility] None  12/11/2022 10:55 AM Jerene Bears, MD [facility]  12/18/2022 11:35 AM Jerene Bears, MD [facility]  12/25/2022 10:55 AM Jerene Bears, MD [facility]    Jerene Bears, MD

## 2022-12-04 NOTE — Addendum Note (Signed)
Addended by: Jerene Bears on: 12/04/2022 12:22 PM   Modules accepted: Orders

## 2022-12-05 DIAGNOSIS — Z419 Encounter for procedure for purposes other than remedying health state, unspecified: Secondary | ICD-10-CM | POA: Diagnosis not present

## 2022-12-05 LAB — T.PALLIDUM AB, TOTAL: Treponema pallidum Antibodies: NONREACTIVE

## 2022-12-07 ENCOUNTER — Encounter (HOSPITAL_BASED_OUTPATIENT_CLINIC_OR_DEPARTMENT_OTHER): Payer: Self-pay | Admitting: Obstetrics & Gynecology

## 2022-12-07 ENCOUNTER — Other Ambulatory Visit (INDEPENDENT_AMBULATORY_CARE_PROVIDER_SITE_OTHER): Payer: 59

## 2022-12-07 ENCOUNTER — Ambulatory Visit (INDEPENDENT_AMBULATORY_CARE_PROVIDER_SITE_OTHER): Payer: 59 | Admitting: Obstetrics & Gynecology

## 2022-12-07 VITALS — BP 124/77 | HR 79 | Wt 139.0 lb

## 2022-12-07 DIAGNOSIS — L299 Pruritus, unspecified: Secondary | ICD-10-CM | POA: Diagnosis not present

## 2022-12-07 DIAGNOSIS — Z3483 Encounter for supervision of other normal pregnancy, third trimester: Secondary | ICD-10-CM

## 2022-12-07 DIAGNOSIS — R87612 Low grade squamous intraepithelial lesion on cytologic smear of cervix (LGSIL): Secondary | ICD-10-CM | POA: Diagnosis not present

## 2022-12-07 DIAGNOSIS — E611 Iron deficiency: Secondary | ICD-10-CM

## 2022-12-07 DIAGNOSIS — Z3A37 37 weeks gestation of pregnancy: Secondary | ICD-10-CM

## 2022-12-07 DIAGNOSIS — Z0371 Encounter for suspected problem with amniotic cavity and membrane ruled out: Secondary | ICD-10-CM

## 2022-12-07 DIAGNOSIS — Z8659 Personal history of other mental and behavioral disorders: Secondary | ICD-10-CM | POA: Diagnosis not present

## 2022-12-07 DIAGNOSIS — R768 Other specified abnormal immunological findings in serum: Secondary | ICD-10-CM

## 2022-12-08 ENCOUNTER — Encounter (HOSPITAL_BASED_OUTPATIENT_CLINIC_OR_DEPARTMENT_OTHER): Payer: Self-pay | Admitting: Obstetrics & Gynecology

## 2022-12-08 LAB — COMPREHENSIVE METABOLIC PANEL
ALT: 14 IU/L (ref 0–32)
AST: 17 IU/L (ref 0–40)
Albumin/Globulin Ratio: 1.3 (ref 1.2–2.2)
Albumin: 3.5 g/dL — ABNORMAL LOW (ref 4.0–5.0)
Alkaline Phosphatase: 244 IU/L — ABNORMAL HIGH (ref 44–121)
BUN/Creatinine Ratio: 9 (ref 9–23)
BUN: 4 mg/dL — ABNORMAL LOW (ref 6–20)
Bilirubin Total: 0.4 mg/dL (ref 0.0–1.2)
CO2: 21 mmol/L (ref 20–29)
Calcium: 9 mg/dL (ref 8.7–10.2)
Chloride: 103 mmol/L (ref 96–106)
Creatinine, Ser: 0.44 mg/dL — ABNORMAL LOW (ref 0.57–1.00)
Globulin, Total: 2.7 g/dL (ref 1.5–4.5)
Glucose: 93 mg/dL (ref 70–99)
Potassium: 4.6 mmol/L (ref 3.5–5.2)
Sodium: 140 mmol/L (ref 134–144)
Total Protein: 6.2 g/dL (ref 6.0–8.5)
eGFR: 139 mL/min/{1.73_m2} (ref >59)

## 2022-12-08 LAB — BILE ACIDS, TOTAL: Bile Acids Total: 11.1 umol/L — ABNORMAL HIGH (ref 0.0–10.0)

## 2022-12-08 NOTE — Progress Notes (Signed)
   PRENATAL VISIT NOTE  Subjective:  Tammie Scott is a 22 y.o. G2P1001 at [redacted]w[redacted]d being seen today for appointment that is out of synch with normal OB care due to itching especially on hands and feet.  This has been present for a little while but she just reached out for complaint of significant change in symptoms.    She is currently monitored for the following issues for this low-risk pregnancy and has Generalized anxiety disorder; Supervision of normal pregnancy; Biological false positive RPR test; LGSIL on Pap smear of cervix; History of major depression; Iron deficiency; and Pica on their problem list.  Is feeling good fetal movement.  Contractions: Irregular. Vag. Bleeding: None.  Movement: Present. Denies leaking of fluid.   The following portions of the patient's history were reviewed and updated as appropriate: allergies, current medications, past family history, past medical history, past social history, past surgical history and problem list.   Objective:   Vitals:   12/07/22 1452  BP: 124/77  Pulse: 79  Weight: 139 lb (63 kg)    Fetal Status: Fetal Heart Rate (bpm): 135   Movement: Present     General:  Alert, oriented and cooperative. Patient is in no acute distress.  Skin: Skin is warm and dry. No rash noted.   Cardiovascular: Normal heart rate noted  Respiratory: Normal respiratory effort, no problems with respiration noted  Abdomen: Soft, gravid, appropriate for gestational age.  Pain/Pressure: Absent     Pelvic: Cervical exam deferred        Extremities: Normal range of motion.  Edema: None  Mental Status: Normal mood and affect. Normal behavior. Normal judgment and thought content.   Assessment and Plan:  Pregnancy: G2P1001 at [redacted]w[redacted]d 1. Itching - called second attending, Dr. Alysia Penna, to see if bile acids testing comes back sooner at [facility] but was advised this is also a send out test. - discussed possible diagnosis of cholestasis in pregnancy and increased  risk for still birth so strict precautions for monitoring discussed with pt.  She understands if bile acids elevated, induction will be indicated - Bile acids, total - Comprehensive metabolic panel - US OB Limited--AFI done today which was normal.  Images will be scanned into the EPIC. - NST reactive today as well  2. Encounter for supervision of other normal pregnancy in third trimester - has appt on Friday and will keep this - on PNV  3. [redacted] weeks gestation of pregnancy  4. Iron deficiency - taking oral iron  5. Biological false positive RPR test - trep ab testing negative last week as actual result from [facility] was not in records  6. History of major depression  7. LGSIL on Pap smear of cervix  Term labor symptoms and general obstetric precautions including but not limited to vaginal bleeding, contractions, leaking of fluid and fetal movement were reviewed in detail with the patient. Please refer to After Visit Summary for other counseling recommendations.   Return for already has appt for 6/7, Friday.  Future Appointments  Date Time Provider Department Center  12/10/2022  2:15 PM [facility] None  12/11/2022 10:55 AM Jerene Bears, MD [facility]  12/18/2022 11:35 AM Jerene Bears, MD [facility]  12/25/2022 10:55 AM Jerene Bears, MD [facility]    Jerene Bears, MD

## 2022-12-09 ENCOUNTER — Inpatient Hospital Stay (HOSPITAL_COMMUNITY)
Admission: AD | Admit: 2022-12-09 | Discharge: 2022-12-11 | DRG: 805 | Disposition: A | Discharge status: Home / Self Care | Payer: 59 | Attending: Obstetrics and Gynecology | Admitting: Obstetrics and Gynecology

## 2022-12-09 ENCOUNTER — Encounter (HOSPITAL_COMMUNITY): Payer: Self-pay | Admitting: Obstetrics and Gynecology

## 2022-12-09 DIAGNOSIS — Z3A37 37 weeks gestation of pregnancy: Secondary | ICD-10-CM

## 2022-12-09 DIAGNOSIS — O26643 Intrahepatic cholestasis of pregnancy, third trimester: Secondary | ICD-10-CM | POA: Diagnosis present

## 2022-12-09 DIAGNOSIS — F411 Generalized anxiety disorder: Secondary | ICD-10-CM | POA: Diagnosis present

## 2022-12-09 DIAGNOSIS — Z3490 Encounter for supervision of normal pregnancy, unspecified, unspecified trimester: Secondary | ICD-10-CM

## 2022-12-09 DIAGNOSIS — O26649 Intrahepatic cholestasis of pregnancy, unspecified trimester: Secondary | ICD-10-CM | POA: Insufficient documentation

## 2022-12-09 DIAGNOSIS — K831 Obstruction of bile duct: Secondary | ICD-10-CM | POA: Diagnosis present

## 2022-12-09 DIAGNOSIS — D509 Iron deficiency anemia, unspecified: Secondary | ICD-10-CM | POA: Diagnosis present

## 2022-12-09 DIAGNOSIS — Z87891 Personal history of nicotine dependence: Secondary | ICD-10-CM

## 2022-12-09 DIAGNOSIS — O9902 Anemia complicating childbirth: Secondary | ICD-10-CM | POA: Diagnosis present

## 2022-12-09 DIAGNOSIS — O99344 Other mental disorders complicating childbirth: Secondary | ICD-10-CM

## 2022-12-09 DIAGNOSIS — O2662 Liver and biliary tract disorders in childbirth: Secondary | ICD-10-CM

## 2022-12-09 LAB — CBC
HCT: 36.9 % (ref 36.0–46.0)
Hemoglobin: 11.5 g/dL — ABNORMAL LOW (ref 12.0–15.0)
MCH: 25.2 pg — ABNORMAL LOW (ref 26.0–34.0)
MCHC: 31.2 g/dL (ref 30.0–36.0)
MCV: 80.7 fL (ref 80.0–100.0)
Platelets: 121 10*3/uL — ABNORMAL LOW (ref 150–400)
RBC: 4.57 MIL/uL (ref 3.87–5.11)
RDW: 19.7 % — ABNORMAL HIGH (ref 11.5–15.5)
WBC: 6.7 10*3/uL (ref 4.0–10.5)
nRBC: 0 % (ref 0.0–0.2)

## 2022-12-09 LAB — TYPE AND SCREEN
ABO/RH(D): A POS
Antibody Screen: NEGATIVE

## 2022-12-09 MED ORDER — OXYTOCIN-SODIUM CHLORIDE 30-0.9 UT/500ML-% IV SOLN: 1.0000 m[IU]/min | INTRAVENOUS | Status: DC

## 2022-12-09 MED ORDER — OXYCODONE-ACETAMINOPHEN 5-325 MG PO TABS: 1.0000 | ORAL_TABLET | ORAL | Status: DC | PRN

## 2022-12-09 MED ORDER — LACTATED RINGERS IV SOLN: INTRAVENOUS | Status: DC

## 2022-12-09 MED ORDER — ONDANSETRON HCL 4 MG/2ML IJ SOLN: 4.0000 mg | Freq: Four times a day (QID) | INTRAMUSCULAR | Status: DC | PRN

## 2022-12-09 MED ORDER — TERBUTALINE SULFATE 1 MG/ML IJ SOLN: 0.2500 mg | Freq: Once | INTRAMUSCULAR | Status: DC | PRN

## 2022-12-09 MED ORDER — SOD CITRATE-CITRIC ACID 500-334 MG/5ML PO SOLN: 30.0000 mL | ORAL | Status: DC | PRN

## 2022-12-09 MED ORDER — OXYCODONE-ACETAMINOPHEN 5-325 MG PO TABS: 2.0000 | ORAL_TABLET | ORAL | Status: DC | PRN

## 2022-12-09 MED ORDER — LIDOCAINE HCL (PF) 1 % IJ SOLN: 30.0000 mL | INTRAMUSCULAR | Status: DC | PRN

## 2022-12-09 MED ORDER — FLEET ENEMA 7-19 GM/118ML RE ENEM: 1.0000 | ENEMA | RECTAL | Status: DC | PRN

## 2022-12-09 MED ORDER — LACTATED RINGERS IV SOLN: 500.0000 mL | INTRAVENOUS | Status: DC | PRN

## 2022-12-09 MED ORDER — OXYTOCIN BOLUS FROM INFUSION
333.0000 mL | Freq: Once | INTRAVENOUS | Status: AC
  Administered 2022-12-09: 333 mL via INTRAVENOUS

## 2022-12-09 MED ORDER — ACETAMINOPHEN 325 MG PO TABS: 650.0000 mg | ORAL_TABLET | ORAL | Status: DC | PRN

## 2022-12-09 MED ORDER — OXYTOCIN-SODIUM CHLORIDE 30-0.9 UT/500ML-% IV SOLN
2.5000 [IU]/h | INTRAVENOUS | Status: DC
  Filled 2022-12-09: qty 500

## 2022-12-09 NOTE — Discharge Summary (Signed)
Postpartum Discharge Summary  Date of Service updated 12/11/2022      Patient Name: Tammie Scott DOB: [birth year] MRN: [id number]  Date of admission: 12/09/2022 Delivery date:12/09/2022  Delivering provider: Myrtie Hawk  Date of discharge: 12/11/2022  Admitting diagnosis: Labor and delivery, indication for care [O75.9] Intrauterine pregnancy: [redacted]w[redacted]d     Secondary diagnosis:  Principal Problem:   Vaginal delivery Active Problems:   Generalized anxiety disorder   Supervision of normal pregnancy   Intrahepatic cholestasis of pregnancy  Additional problems: None    Discharge diagnosis: Term Pregnancy Delivered and Anemia                                              Post partum procedures: none Augmentation: AROM Complications: None  Hospital course: Induction of Labor With Vaginal Delivery   22 y.o. yo G2P1001 at [redacted]w[redacted]d was admitted to the hospital 12/09/2022 for induction of labor.  Indication for induction: Cholestasis of pregnancy.  Patient had an labor course complicated by none. Membrane Rupture Time/Date: 3:02 PM ,12/09/2022   Delivery Method:Vaginal, Spontaneous  Episiotomy: None  Lacerations:  Periurethral  Details of delivery can be found in separate delivery note. Uncomplicated postpartum course. Patient is discharged home 12/11/22.  Newborn Data: Birth date:12/09/2022  Birth time:11:16 PM  Gender:Female  Living status:Living  Apgars:8 ,9  Weight:2760 g   Magnesium Sulfate received: No BMZ received: No Rhophylac:No MMR:No T-DaP:Given prenatally Flu: No Transfusion:No  Physical exam  Vitals:   12/10/22 0904 12/10/22 1235 12/10/22 2131 12/11/22 0630  BP: 94/61 101/60 100/62 111/71  Pulse: 66 84 77 78  Resp: 16 16 16 16   Temp: 98 F (36.7 C) 97.6 F (36.4 C) 98.1 F (36.7 C)   TempSrc: Oral Oral Oral   SpO2:   98% 99%   General: alert, cooperative, and no distress Lochia: appropriate Uterine Fundus: firm Incision: N/A DVT Evaluation: No  evidence of DVT seen on physical exam. No significant calf/ankle edema. Labs: Lab Results  Component Value Date   WBC 13.2 (H) 12/10/2022   HGB 10.4 (L) 12/10/2022   HCT 33.4 (L) 12/10/2022   MCV 80.7 12/10/2022   PLT 126 (L) 12/10/2022      Latest Ref Rng & Units 12/07/2022    3:11 PM  CMP  Glucose 70 - 99 mg/dL 93   BUN 6 - 20 mg/dL 4   Creatinine 4.09 - 8.11 mg/dL 9.14   Sodium 782 - 956 mmol/L 140   Potassium 3.5 - 5.2 mmol/L 4.6   Chloride 96 - 106 mmol/L 103   CO2 20 - 29 mmol/L 21   Calcium 8.7 - 10.2 mg/dL 9.0   Total Protein 6.0 - 8.5 g/dL 6.2   Total Bilirubin 0.0 - 1.2 mg/dL 0.4   Alkaline Phos 44 - 121 IU/L 244   AST 0 - 40 IU/L 17   ALT 0 - 32 IU/L 14    Edinburgh Score:    12/02/2021   10:38 AM  Edinburgh Postnatal Depression Scale Screening Tool  I have been able to laugh and see the funny side of things. 0  I have looked forward with enjoyment to things. 0  I have blamed myself unnecessarily when things went wrong. 0  I have been anxious or worried for no good reason. 0  I have felt scared or panicky for no good reason. 0  Things have been getting on top of me. 0  I have been so unhappy that I have had difficulty sleeping. 0  I have felt sad or miserable. 0  I have been so unhappy that I have been crying. 0  The thought of harming myself has occurred to me. 0  Edinburgh Postnatal Depression Scale Total 0     After visit meds:  Allergies as of 12/11/2022   No Known Allergies      Medication List     TAKE these medications    acetaminophen 325 MG tablet Commonly known as: Tylenol Take 2 tablets (650 mg total) by mouth every 4 (four) hours for 8 days.   ibuprofen 600 MG tablet Commonly known as: ADVIL Take 1 tablet (600 mg total) by mouth every 6 (six) hours.   multivitamin-prenatal 27-0.8 MG Tabs tablet Take 1 tablet by mouth daily at 12 noon.         Discharge home in stable condition Infant Feeding: Breast Infant Disposition:home  with mother Discharge instruction: per After Visit Summary and Postpartum booklet. Activity: Advance as tolerated. Pelvic rest for 6 weeks.  Diet: routine diet Future Appointments: Future Appointments  Date Time Provider Department Center  01/20/2023 10:35 AM Jerene Bears, MD [facility]   Follow up Visit: Message sent to [facility] 6/5  Please schedule this patient for a In person postpartum visit in 6 weeks with the following provider: Any provider. Additional Postpartum F/U:Postpartum Depression checkup  High risk pregnancy complicated by:  Cholestasis Delivery mode:  Vaginal, Spontaneous  Anticipated Birth Control:   Vasectomy   12/11/2022 Tiffany Kocher, DO  Attestation of Attending Supervision of Family Practice Resident: Evaluation and management procedures were performed by the resident under my supervision and collaboration.  I have reviewed the resident's note and chart, and I agree with the management and plan.  Scheryl Darter, MD, FACOG Attending Obstetrician & Gynecologist Faculty Practice, [facility]

## 2022-12-09 NOTE — H&P (Signed)
Tammie Scott is a 22 y.o. female, G2P1001 at 37.6 weeks, presenting for IOL s/t ICP.  Patient receives care at [facility] and was supervised for a low- risk pregnancy. Pregnancy and medical history significant for problems as listed below. She is GBS negative and expresses a desire for no to minimal interventions for pain management.  She is anticipating a female infant and is unsure of PP birth control method.    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 Paul Half  Labs drawn for c/o itching. Limited US completed and normal. NST reactive BP: 124/77  Pulse: 79  Weight: 139 lb (63 kg)     NURSING  PROVIDER  Office Location [facility] Dating by U/S at 12 wks  [facility] Model Traditional Anatomy U/S  wnl  Initiated care at  11wks                Language  English              LAB RESULTS   Support Person   Genetics NIPS:   neg    neg       AFP:                NT/IT (FT only)     Carrier Screen Horizon: neg 4/4/ 06/02/2021  Rhogam  A/Positive/-- (12/18 0000)N/A A1C/GTT Early:             Third trimester:120-1 hr on 10/06/2022  Flu Vaccine deferred    TDaP Vaccine  11/10/2022 Blood Type A/Positive/-- (12/18 0000)A positive  Covid Vaccine X 3, pfizer Antibody  negative    Rubella Immune (12/18 0000)immune  Feeding Plan breast RPR Reactive (12/18 0000) Positive, titer 1:8, Tpal neg  Contraception Copper IUD? HBsAg Negative (12/18 0000)nonreactive  Circumcision unsure HIV Non-reactive (12/18 0000)nonreactive  Pediatrician  Dr. Tami Ribas at [facility]  HCVAb Negative (12/18 0000)negative  Prenatal Classes Info given      Pap Diagnosis  Date Value Ref Range Status  12/02/2021 - Low grade squamous intraepithelial lesion (LSIL) (A)  Final    BTLConsent  GC/CT Initial:             36wks: neg/neg  VBAC  Consent NA GBS Negative/-- (05/24 1259) Negative For PCN allergy, check sensitivities        DME Rx [x]  BP cuff [ ]  Weight Scale Waterbirth  [ ]  Class [ ]  Consent [ ]  CNM visit  PHQ9 & GAD7 [  ] new OB [x]  28 weeks  [x ] 36 weeks Induction  [ ]  Orders Entered [ ] Foley Y/N     OB History     Gravida  2   Para  1   Term  1   Preterm      AB      Living  1      SAB      IAB      Ectopic      Multiple  0   Live Births  1  Past Medical History:  Diagnosis Date   COVID    Dysuria    MDD (major depressive disorder)    Medical history non-contributory    Mood changes    UTI (urinary tract infection)    Vasovagal syncope    Viral syndrome    Past Surgical History:  Procedure Laterality Date   EYE EXAMINATION UNDER ANESTHESIA Left    NO PAST SURGERIES     Family History: family history includes Asthma in her brother; Diabetes in her maternal grandfather; Epilepsy in her brother. Social History:  reports that she quit smoking about 2 years ago. Her smoking use included cigarettes. She has never used smokeless tobacco. She reports that she does not currently use drugs after having used the following drugs: Marijuana. She reports that she does not drink alcohol.   Prenatal Transfer Tool  Maternal Diabetes: No Genetic Screening: Normal Maternal Ultrasounds/Referrals: Normal Fetal Ultrasounds or other Referrals:  None Maternal Substance Abuse:  No Significant Maternal Medications:  None Significant Maternal Lab Results: Group B Strep negative   Maternal Assessment:  ROS: +Contractions, -LOF, +Vaginal Bleeding (After Exam), +Fetal Movement  All other systems reviewed and negative.    No  Known Allergies   Dilation: 3.5 Effacement (%): 90 Station: 0, Plus 1 Exam by:: m wilkins rnc Last menstrual period 03/10/2022, currently breastfeeding.  Physical Exam Vitals reviewed.  Constitutional:      Appearance: Normal appearance.  HENT:     Head: Normocephalic and atraumatic.  Eyes:     Conjunctiva/sclera: Conjunctivae normal.  Cardiovascular:     Rate and Rhythm: Normal rate.  Pulmonary:     Effort: Pulmonary effort is normal. No respiratory distress.  Abdominal:     General: Bowel sounds are normal.     Palpations: Abdomen is soft.     Tenderness: There is no abdominal tenderness.  Musculoskeletal:        General: Normal range of motion.     Cervical back: Normal range of motion.     Right lower leg: No edema.     Left lower leg: No edema.  Skin:    General: Skin is warm and dry.  Neurological:     Mental Status: She is alert and oriented to person, place, and time.  Psychiatric:        Mood and Affect: Mood normal.        Behavior: Behavior normal.    Fetal Assessment: Leopolds: -Pelvis: Proven to 3230g -EFW: 7lbs -Presentation: Vertex by Nurse exam  FHR: 145 bpm, Mod Var, -Decels, +Accels UCs:  Palpates mild    Assessment IUP at 37.6 weeks Cat I FT ICP GBS Negative Anemia  Plan: Admit to [employer]  Routine Labor and Delivery Orders per Protocol Induction orders placed with pitocin at 2x1. No cervical ripening indicated.  In room to complete assessment and discuss POC: -Discussed r/b of induction including fetal distress, serial induction, pain, and increased risk of c/s delivery -Discussed induction methods including AROM and pitocin -Patient verbalizes understanding and questions necessity of pitocin. -Informed that it is her right to refuse, but that if contraction/labor pattern is inadequate the initiation of pitocin may be beneficial. -Discussed patient desire to go without epidural and support given. -Will allow time to ambulate  and then consider AROM and pitocin if necessary.  -Patient agreeable with plan.  -Admission HgB 11.5   Tammie Quails, MSN 12/09/2022, 1:26 PM

## 2022-12-09 NOTE — Progress Notes (Signed)
Labor Progress Note  Tammie Scott is a 22 y.o. G2P1001 at [redacted]w[redacted]d presented for IOL ICP  S: pt doing well, feeling pressure  O:  BP 112/70   Pulse 82   Temp 98.1 F (36.7 C) (Oral)   LMP 03/10/2022  EFM:135 bpm/Moderate variability/ 15x15 accels/ None decels CAT: 1 Toco: regular, every 2-5 minutes   CVE: Dilation: 4.5 Effacement (%): 80 Cervical Position: Posterior Station: 0 Presentation: Vertex Exam by:: Marylene Buerger   A&P: 22 y.o. G2P1001 [redacted]w[redacted]d  here for IOL as above  #Labor: Progressing well. Will consider pitocin at next SVE if no cervical chenge is made #Pain: Family/Friend support and PO/IV pain meds #FWB: CAT 1 #GBS negative #ICP:  BA 11.1. No sx at this time. CTM  Myrtie Hawk, DO FMOB Fellow, Faculty practice [facility], Center for [facility] 12/09/22  8:49 PM

## 2022-12-09 NOTE — Progress Notes (Signed)
Patient ID: Tammie Scott, female   DOB: [birth year], 22 y.o.   MRN: [id number]  Subjective: -Patient reports increased discomfort with contractions. Discussed AROM with nurse and would like to proceed. Family at bedside, supportive.   Objective: BP 111/82   Pulse 87   Temp 98.1 F (36.7 C) (Oral)   LMP 03/10/2022  No intake/output data recorded. No intake/output data recorded.  Fetal Monitoring: FHT: 145 bpm, Mod Var, -Decels, -Accels UC: palpates mild    Vaginal Exam: SVE:   Dilation: 3.5 Effacement (%): 90 Station: 0, Plus 1 Exam by:: Elesa Garman, CNM Membranes:AROM with moderate amt clear fluid.  Internal Monitors: None  Augmentation/Induction: Pitocin:None Cytotec: None  Assessment:  IUP at 37.6 weeks Cat I FT  Amniotomy  Plan: -Discussed AROM r/b including increased risk of infection, cord prolapse, fetal intolerance, and decreased labor time. No questions or concerns and patient desires to proceed with AROM.  -Pitocin ordered and available as needed. -Discussed availability of IV pain medication if desired.  -Continue other mgmt as ordered   Valma Cava, CNM Advanced Practice Provider, Center for [facility] 12/09/2022, 2:55 PM

## 2022-12-10 ENCOUNTER — Ambulatory Visit: Payer: Medicaid Other

## 2022-12-10 ENCOUNTER — Encounter (HOSPITAL_COMMUNITY): Payer: Self-pay | Admitting: Obstetrics and Gynecology

## 2022-12-10 ENCOUNTER — Encounter: Payer: Self-pay | Admitting: Obstetrics & Gynecology

## 2022-12-10 LAB — CBC
HCT: 33.4 % — ABNORMAL LOW (ref 36.0–46.0)
Hemoglobin: 10.4 g/dL — ABNORMAL LOW (ref 12.0–15.0)
MCH: 25.1 pg — ABNORMAL LOW (ref 26.0–34.0)
MCHC: 31.1 g/dL (ref 30.0–36.0)
MCV: 80.7 fL (ref 80.0–100.0)
Platelets: 126 10*3/uL — ABNORMAL LOW (ref 150–400)
RBC: 4.14 MIL/uL (ref 3.87–5.11)
RDW: 19.8 % — ABNORMAL HIGH (ref 11.5–15.5)
WBC: 13.2 10*3/uL — ABNORMAL HIGH (ref 4.0–10.5)
nRBC: 0 % (ref 0.0–0.2)

## 2022-12-10 LAB — RPR
RPR Ser Ql: REACTIVE — AB
RPR Titer: 1:4 {titer}

## 2022-12-10 MED ORDER — OXYCODONE HCL 5 MG PO TABS: 5.0000 mg | ORAL_TABLET | ORAL | Status: DC | PRN

## 2022-12-10 MED ORDER — ONDANSETRON HCL 4 MG PO TABS: 4.0000 mg | ORAL_TABLET | ORAL | Status: DC | PRN

## 2022-12-10 MED ORDER — ACETAMINOPHEN 325 MG PO TABS
650.0000 mg | ORAL_TABLET | ORAL | Status: DC | PRN
  Administered 2022-12-10: 650 mg via ORAL
  Filled 2022-12-10: qty 2

## 2022-12-10 MED ORDER — SODIUM CHLORIDE 0.9 % IV SOLN
200.0000 mg | Freq: Once | INTRAVENOUS | Status: DC
  Filled 2022-12-10: qty 10

## 2022-12-10 MED ORDER — PRENATAL MULTIVITAMIN CH
1.0000 | ORAL_TABLET | Freq: Every day | ORAL | Status: DC
  Administered 2022-12-10 – 2022-12-11 (×2): 1 via ORAL
  Filled 2022-12-10 (×2): qty 1

## 2022-12-10 MED ORDER — ZOLPIDEM TARTRATE 5 MG PO TABS: 5.0000 mg | ORAL_TABLET | Freq: Every evening | ORAL | Status: DC | PRN

## 2022-12-10 MED ORDER — DIBUCAINE (PERIANAL) 1 % EX OINT: 1.0000 | TOPICAL_OINTMENT | CUTANEOUS | Status: DC | PRN

## 2022-12-10 MED ORDER — ONDANSETRON HCL 4 MG/2ML IJ SOLN: 4.0000 mg | INTRAMUSCULAR | Status: DC | PRN

## 2022-12-10 MED ORDER — DIPHENHYDRAMINE HCL 25 MG PO CAPS: 25.0000 mg | ORAL_CAPSULE | Freq: Once | ORAL | Status: DC

## 2022-12-10 MED ORDER — COCONUT OIL OIL: 1.0000 | TOPICAL_OIL | Status: DC | PRN

## 2022-12-10 MED ORDER — ACETAMINOPHEN 325 MG PO TABS: 650.0000 mg | ORAL_TABLET | Freq: Once | ORAL | Status: DC

## 2022-12-10 MED ORDER — SODIUM CHLORIDE 0.9% FLUSH: 3.0000 mL | INTRAVENOUS | Status: DC | PRN

## 2022-12-10 MED ORDER — ACETAMINOPHEN 325 MG PO TABS
650.0000 mg | ORAL_TABLET | ORAL | Status: DC
  Administered 2022-12-10 – 2022-12-11 (×6): 650 mg via ORAL
  Filled 2022-12-10 (×6): qty 2

## 2022-12-10 MED ORDER — IBUPROFEN 600 MG PO TABS
600.0000 mg | ORAL_TABLET | Freq: Four times a day (QID) | ORAL | Status: DC
  Administered 2022-12-10 – 2022-12-11 (×6): 600 mg via ORAL
  Filled 2022-12-10 (×6): qty 1

## 2022-12-10 MED ORDER — SIMETHICONE 80 MG PO CHEW: 80.0000 mg | CHEWABLE_TABLET | ORAL | Status: DC | PRN

## 2022-12-10 MED ORDER — DIPHENHYDRAMINE HCL 25 MG PO CAPS: 25.0000 mg | ORAL_CAPSULE | Freq: Four times a day (QID) | ORAL | Status: DC | PRN

## 2022-12-10 MED ORDER — SODIUM CHLORIDE 0.9% FLUSH: 3.0000 mL | Freq: Two times a day (BID) | INTRAVENOUS | Status: DC

## 2022-12-10 MED ORDER — SENNOSIDES-DOCUSATE SODIUM 8.6-50 MG PO TABS
2.0000 | ORAL_TABLET | ORAL | Status: DC
  Administered 2022-12-10 – 2022-12-11 (×2): 2 via ORAL
  Filled 2022-12-10 (×2): qty 2

## 2022-12-10 MED ORDER — WITCH HAZEL-GLYCERIN EX PADS: 1.0000 | MEDICATED_PAD | CUTANEOUS | Status: DC | PRN

## 2022-12-10 MED ORDER — SODIUM CHLORIDE 0.9 % IV SOLN: 250.0000 mL | INTRAVENOUS | Status: DC | PRN

## 2022-12-10 MED ORDER — BENZOCAINE-MENTHOL 20-0.5 % EX AERO
1.0000 | INHALATION_SPRAY | CUTANEOUS | Status: DC | PRN
  Administered 2022-12-10: 1 via TOPICAL
  Filled 2022-12-10 (×2): qty 56

## 2022-12-10 NOTE — Lactation Note (Signed)
This note was copied from a baby's chart. Lactation Consultation Note  Patient Name: Tammie Scott [id number] Date: 12/10/2022 Age:23 hours Reason for consult: Initial assessment;Early term 22-38.6wks Mom stated she hadn't been long stopped BF before LC went into rm.  Asked mom if she would like to pump since the baby is 6.1 lbs and the baby will drop down below 5 lbs and mom can give the baby back anything she she pumps. Mom agreed. Mom shown how to use DEBP & how to disassemble, clean, & reassemble parts. Mom started pumping and was disappointed because milk wasn't pouring in bottle. Mom was getting out while milk. Praised mom that she has milk. Milk storage reviewed. Mom encouraged to feed baby 8-12 times/24 hours and with feeding cues.  Newborn feeding habits, STS, I&O reviewed. Answered mom's questions. Encouraged mom to call for assistance as needed.  Maternal Data Does the patient have breastfeeding experience prior to this delivery?: Yes How long did the patient breastfeed?: 12 months to her now 53 month old  Feeding    LATCH Score       Type of Nipple: Everted at rest and after stimulation  Comfort (Breast/Nipple): Soft / non-tender         Lactation Tools Discussed/Used Tools: Pump;Flanges Flange Size: 21 Breast pump type: Double-Electric Breast Pump Pump Education: Setup, frequency, and cleaning;Milk Storage Reason for Pumping: mom requested Pumping frequency: prn  Interventions Interventions: DEBP;Breast feeding basics reviewed;LC Services brochure;Coconut oil  Discharge    Consult Status Consult Status: PRN Date: 12/11/22 Follow-up type: In-patient    Charyl Dancer 12/10/2022, 6:17 AM

## 2022-12-10 NOTE — Social Work (Signed)
MOB was referred for history of depression/anxiety.  * Referral screened out by Clinical Social Worker because none of the following criteria appear to apply:  ~ History of anxiety/depression during this pregnancy, or of post-partum depression following prior delivery. Per OB records no concerns during this pregnancy.  ~ Diagnosis of anxiety and/or depression within last 3 years. Per chart review MOB diagnosis dates back to 2017. OR * MOB's symptoms currently being treated with medication and/or therapy.  Please contact the Clinical Social Worker if needs arise, by [facility] request, or if MOB scores greater than 9/yes to question 10 on Edinburgh Postpartum Depression Screen.  Wende Neighbors, LCSWA Clinical Social Worker [phone]

## 2022-12-10 NOTE — Progress Notes (Signed)
POSTPARTUM PROGRESS NOTE  Post Partum Day 1  Subjective:  Tammie Scott is a 22 y.o. [id number] s/p VD at [redacted]w[redacted]d.  She reports she is doing well. No acute events overnight. She denies any problems with ambulating, voiding or po intake. Denies nausea or vomiting.  Pain is moderately controlled.  Lochia is adequate.  Objective: Blood pressure 114/70, pulse 82, temperature 98 F (36.7 C), temperature source Oral, resp. rate 16, last menstrual period 03/10/2022, SpO2 98 %, unknown if currently breastfeeding.  Physical Exam:  General: alert, cooperative and no distress Chest: no respiratory distress Heart:regular rate, distal pulses intact Uterine Fundus: firm, appropriately tender DVT Evaluation: No calf swelling or tenderness Extremities: no edema Skin: warm, dry  Recent Labs    12/09/22 1230 12/10/22 0552  HGB 11.5* 10.4*  HCT 36.9 33.4*    Assessment/Plan: Tammie Scott is a 22 y.o. [id number] s/p VD at [redacted]w[redacted]d   PPD#1 - Doing well  Routine postpartum care Adjusted pain meds Contraception: vasectomy Feeding: Breast Dispo: Plan for discharge tomorrow.   LOS: 1 day   Myrtie Hawk, DO OB Fellow  12/10/2022, 8:28 AM

## 2022-12-11 ENCOUNTER — Encounter (HOSPITAL_BASED_OUTPATIENT_CLINIC_OR_DEPARTMENT_OTHER): Payer: Medicaid Other | Admitting: Obstetrics & Gynecology

## 2022-12-11 ENCOUNTER — Encounter: Payer: Self-pay | Admitting: Obstetrics & Gynecology

## 2022-12-11 ENCOUNTER — Other Ambulatory Visit (HOSPITAL_COMMUNITY): Payer: Self-pay

## 2022-12-11 LAB — T.PALLIDUM AB, TOTAL: T Pallidum Abs: NONREACTIVE

## 2022-12-11 MED ORDER — IBUPROFEN 600 MG PO TABS
600.0000 mg | ORAL_TABLET | Freq: Four times a day (QID) | ORAL | 0 refills | Status: DC
  Filled 2022-12-11: qty 30, 8d supply, fill #0

## 2022-12-11 MED ORDER — ACETAMINOPHEN 325 MG PO TABS
650.0000 mg | ORAL_TABLET | ORAL | 0 refills | Status: AC
  Filled 2022-12-11: qty 100, 9d supply, fill #0

## 2022-12-11 NOTE — Lactation Note (Signed)
This note was copied from a baby's chart. Lactation Consultation Note  Patient Name: Tammie Scott [id number] Date: 12/11/2022 Age:23 hours Reason for consult: Follow-up assessment  P2, Baby [redacted]w[redacted]d.  Baby was latched upon entering.  Brought baby in closer so mother's nipples do not get sore. Reviewed engorgement care and monitoring voids/stools.  Maternal Data Has patient been taught Hand Expression?: Yes Does the patient have breastfeeding experience prior to this delivery?: Yes  Feeding Mother's Current Feeding Choice: Breast Milk  LATCH Score Latch: Grasps breast easily, tongue down, lips flanged, rhythmical sucking.  Audible Swallowing: A few with stimulation  Type of Nipple: Everted at rest and after stimulation  Comfort (Breast/Nipple): Soft / non-tender  Hold (Positioning): Assistance needed to correctly position infant at breast and maintain latch.  LATCH Score: 8   Lactation Tools Discussed/Used    Interventions Interventions: Education  Discharge Discharge Education: Engorgement and breast care;Warning signs for feeding baby  Consult Status Consult Status: Complete Date: 12/11/22    Dahlia Byes [facility] 12/11/2022, 10:10 AM

## 2022-12-11 NOTE — Progress Notes (Signed)
Circumcision Consent  Discussed with mom at bedside about circumcision.   Circumcision is a surgery that removes the skin that covers the tip of the penis, called the "foreskin." Circumcision is usually done when a boy is between 110 and 66 days old, sometimes up to 56-66 weeks old.  The most common reasons boys are circumcised include for cultural/religious beliefs or for parental preference (potentially easier to clean, so baby looks like daddy, etc).  There may be some medical benefits for circumcision:   Circumcised boys seem to have slightly lower rates of: ? Urinary tract infections (per the American Academy of Pediatrics an uncircumcised boy has a 1/100 chance of developing a UTI in the first year of life, a circumcised boy at a 07/998 chance of developing a UTI in the first year of life- a 10% reduction) ? Penis cancer (typically rare- an uncircumcised female has a 1 in 100,000 chance of developing cancer of the penis) ? Sexually transmitted infection (in endemic areas, including HIV, HPV and Herpes- circumcision does NOT protect against gonorrhea, chlamydia, trachomatis, or syphilis) ? Phimosis: a condition where that makes retraction of the foreskin over the glans impossible (0.4 per 1000 boys per year or 0.6% of boys are affected by their 15th birthday)  Boys and men who are not circumcised can reduce these extra risks by: ? Cleaning their penis well ? Using condoms during sex  What are the risks of circumcision?  As with any surgical procedure, there are risks and complications. In circumcision, complications are rare and usually minor, the most common being: ? Bleeding- risk is reduced by holding each clamp for 30 seconds prior to a cut being made, and by holding pressure after the procedure is done ? Infection- the penis is cleaned prior to the procedure, and the procedure is done under sterile technique ? Damage to the urethra or amputation of the penis  How is circumcision done  in baby boys?  The baby will be placed on a special table and the legs restrained for their safety. Numbing medication is injected into the penis, and the skin is cleansed with betadine to decrease the risk of infection.   What to expect:  The penis will look red and raw for 5-7 days as it heals. We expect scabbing around where the cut was made, as well as clear-pink fluid and some swelling of the penis right after the procedure. If your baby's circumcision starts to bleed or develops pus, please contact your pediatrician immediately.  All questions were answered and mother consented.  Scheryl Darter Obstetrics FellowPatient ID: Tammie Scott, female   DOB: [birth year], 22 y.o.   MRN: [id number]

## 2022-12-14 ENCOUNTER — Encounter (HOSPITAL_BASED_OUTPATIENT_CLINIC_OR_DEPARTMENT_OTHER): Payer: Self-pay | Admitting: Obstetrics & Gynecology

## 2022-12-16 ENCOUNTER — Encounter: Payer: Self-pay | Admitting: Obstetrics & Gynecology

## 2022-12-17 ENCOUNTER — Encounter: Payer: Self-pay | Admitting: Obstetrics & Gynecology

## 2022-12-18 ENCOUNTER — Encounter (HOSPITAL_BASED_OUTPATIENT_CLINIC_OR_DEPARTMENT_OTHER): Payer: Medicaid Other | Admitting: Obstetrics & Gynecology

## 2022-12-25 ENCOUNTER — Encounter (HOSPITAL_BASED_OUTPATIENT_CLINIC_OR_DEPARTMENT_OTHER): Payer: Medicaid Other | Admitting: Obstetrics & Gynecology

## 2022-12-29 ENCOUNTER — Telehealth (HOSPITAL_COMMUNITY): Payer: Self-pay

## 2022-12-29 NOTE — Telephone Encounter (Signed)
12/29/2022 1442  Name: Tammie Scott MRN: [id number] DOB: [birth year]  Reason for Call:  Transition of Care Hospital Discharge Call  Contact Status: Patient Contact Status: Message  Language assistant needed: Interpreter Mode: Interpreter Not Needed        Follow-Up Questions:    Inocente Salles Postnatal Depression Scale:  In the Past 7 Days:    PHQ2-9 Depression Scale:     Discharge Follow-up:    Post-discharge interventions: NA  Signature  Signe Colt

## 2022-12-29 NOTE — Telephone Encounter (Signed)
12/29/2022 1305  Name: Tammie Scott MRN: [id number] DOB: Mar 10, 2001  Reason for Call:  Transition of Care Hospital Discharge Call  Contact Status:    Language assistant needed:          Follow-Up Questions:   Patient's partner answered phone call. Partner states that patient is not available to talk at this time.  Edinburgh Postnatal Depression Scale:  In the Past 7 Days:    PHQ2-9 Depression Scale:     Discharge Follow-up:    Post-discharge interventions: NA  Signature  Signe Colt

## 2023-01-04 DIAGNOSIS — Z419 Encounter for procedure for purposes other than remedying health state, unspecified: Secondary | ICD-10-CM | POA: Diagnosis not present

## 2023-01-20 ENCOUNTER — Encounter: Payer: Self-pay | Admitting: Obstetrics & Gynecology

## 2023-01-20 ENCOUNTER — Ambulatory Visit (INDEPENDENT_AMBULATORY_CARE_PROVIDER_SITE_OTHER): Payer: 59 | Admitting: Obstetrics & Gynecology

## 2023-01-20 ENCOUNTER — Other Ambulatory Visit (HOSPITAL_COMMUNITY)
Admission: RE | Admit: 2023-01-20 | Discharge: 2023-01-20 | Disposition: A | Discharge status: Home / Self Care | Payer: 59 | Source: Ambulatory Visit | Attending: Obstetrics & Gynecology | Admitting: Obstetrics & Gynecology

## 2023-01-20 ENCOUNTER — Encounter (HOSPITAL_BASED_OUTPATIENT_CLINIC_OR_DEPARTMENT_OTHER): Payer: Self-pay | Admitting: Obstetrics & Gynecology

## 2023-01-20 DIAGNOSIS — R87612 Low grade squamous intraepithelial lesion on cytologic smear of cervix (LGSIL): Secondary | ICD-10-CM | POA: Diagnosis not present

## 2023-01-20 DIAGNOSIS — A530 Latent syphilis, unspecified as early or late: Secondary | ICD-10-CM

## 2023-01-20 DIAGNOSIS — Z1331 Encounter for screening for depression: Secondary | ICD-10-CM | POA: Diagnosis not present

## 2023-01-20 NOTE — Progress Notes (Signed)
Post Partum Visit Note  Tammie Scott is a 22 y.o. [phone] female who presents for a postpartum visit. She is 6 weeks postpartum following a normal spontaneous vaginal delivery.  I have fully reviewed the prenatal and intrapartum course. The delivery was at 37 6/7 gestational weeks.  Anesthesia: none. Postpartum course has been good. Baby is doing well. Baby is feeding by breast. Bleeding  is minimal and down to spotting only . Bowel function is normal. Bladder function is normal. Patient is sexually active. Contraception method is going to be a vasectomy. Postpartum depression screening: negative.   The pregnancy intention screening data noted above was reviewed. Potential methods of contraception were discussed. The patient elected to proceed with No data recorded.    Health Maintenance Due  Topic Date Due   HPV VACCINES (2 - 2-dose series) 10/19/2014   COVID-19 Vaccine (4 - 2023-24 season) 03/06/2022    The following portions of the patient's history were reviewed and updated as appropriate: allergies, current medications, past family history, past medical history, past social history, past surgical history, and problem list.  Review of Systems Pertinent items noted in HPI and remainder of comprehensive ROS otherwise negative.  Objective:  BP 118/78 (BP Location: Left Arm, Patient Position: Sitting, Cuff Size: Normal)   Pulse 68   Wt 131 lb (59.4 kg)   LMP 03/10/2022   BMI 23.96 kg/m    General:  alert and no distress   Breasts:  normal  Lungs: clear to auscultation bilaterally  Heart:  regular rate and rhythm, S1, S2 normal, no murmur, click, rub or gallop  Abdomen: soft, non-tender; bowel sounds normal; no masses,  no organomegaly   Wound N/a  GU exam:   NAEFG, vaginal without lesions, cervix closed, pap obtained, Uterus normal sized and non tender       Assessment:   1. Postpartum care and examination  2. LGSIL on Pap smear of cervix - Cytology - PAP(  )  3. Positive RPR test - T.pallidum Ab, Total; Future.  She will return in about 6 months for repeat.  Plan:   Essential components of care per ACOG recommendations:  1.  Mood and well being: Patient with negative depression screening today. Reviewed local resources for support.  - Patient tobacco use? No.   - hx of drug use? No.    2. Infant care and feeding:  -Patient currently breastmilk feeding?  -Social determinants of health (SDOH) reviewed in EPIC. No concerns.  3. Sexuality, contraception and birth spacing - Patient does not want a pregnancy in the next year.  Desired family size is 2 children.  - Reviewed reproductive life planning. Reviewed contraceptive methods based on pt preferences and effectiveness.  They are planning for him to have a vasectomy - Discussed birth spacing of 18 months  4. Sleep and fatigue -Encouraged family/partner/community support of 4 hrs of uninterrupted sleep to help with mood and fatigue  5. Physical Recovery  - Discussed patients delivery and complications. She describes her labor as good. - Patient had a Vaginal, no problems at delivery. Patient had a 1st degree laceration. Perineal healing reviewed. Patient expressed understanding - Patient has urinary incontinence? No. - Patient is safe to resume physical and sexual activity  6.  Health Maintenance - Last pap smear  Diagnosis  Date Value Ref Range Status  12/02/2021 - Low grade squamous intraepithelial lesion (LSIL) (A)  Final   Pap smear done at today's visit.  -Breast Cancer  screening indicated? No.   7. Chronic Disease/Pregnancy Condition follow up: None  Jerene Bears, MD Center for [facility], [facility]

## 2023-01-22 LAB — CYTOLOGY - PAP: Diagnosis: NEGATIVE

## 2023-01-27 ENCOUNTER — Encounter: Payer: Self-pay | Admitting: Obstetrics & Gynecology

## 2023-02-04 DIAGNOSIS — Z419 Encounter for procedure for purposes other than remedying health state, unspecified: Secondary | ICD-10-CM | POA: Diagnosis not present

## 2023-03-07 DIAGNOSIS — Z419 Encounter for procedure for purposes other than remedying health state, unspecified: Secondary | ICD-10-CM | POA: Diagnosis not present

## 2023-04-06 DIAGNOSIS — Z419 Encounter for procedure for purposes other than remedying health state, unspecified: Secondary | ICD-10-CM | POA: Diagnosis not present

## 2023-05-07 DIAGNOSIS — Z419 Encounter for procedure for purposes other than remedying health state, unspecified: Secondary | ICD-10-CM | POA: Diagnosis not present

## 2023-06-06 DIAGNOSIS — Z419 Encounter for procedure for purposes other than remedying health state, unspecified: Secondary | ICD-10-CM | POA: Diagnosis not present

## 2023-06-10 ENCOUNTER — Other Ambulatory Visit (HOSPITAL_BASED_OUTPATIENT_CLINIC_OR_DEPARTMENT_OTHER): Payer: 59

## 2023-07-07 DIAGNOSIS — Z419 Encounter for procedure for purposes other than remedying health state, unspecified: Secondary | ICD-10-CM | POA: Diagnosis not present

## 2023-08-07 DIAGNOSIS — Z419 Encounter for procedure for purposes other than remedying health state, unspecified: Secondary | ICD-10-CM | POA: Diagnosis not present

## 2023-09-04 DIAGNOSIS — Z419 Encounter for procedure for purposes other than remedying health state, unspecified: Secondary | ICD-10-CM | POA: Diagnosis not present

## 2023-10-16 DIAGNOSIS — Z419 Encounter for procedure for purposes other than remedying health state, unspecified: Secondary | ICD-10-CM | POA: Diagnosis not present

## 2023-11-15 DIAGNOSIS — Z419 Encounter for procedure for purposes other than remedying health state, unspecified: Secondary | ICD-10-CM | POA: Diagnosis not present

## 2023-12-16 DIAGNOSIS — Z419 Encounter for procedure for purposes other than remedying health state, unspecified: Secondary | ICD-10-CM | POA: Diagnosis not present

## 2024-03-15 ENCOUNTER — Ambulatory Visit (HOSPITAL_BASED_OUTPATIENT_CLINIC_OR_DEPARTMENT_OTHER): Admitting: Certified Nurse Midwife

## 2024-03-21 ENCOUNTER — Other Ambulatory Visit (HOSPITAL_COMMUNITY)
Admission: RE | Admit: 2024-03-21 | Discharge: 2024-03-21 | Disposition: A | Discharge status: Home / Self Care | Payer: Self-pay | Source: Ambulatory Visit | Attending: Certified Nurse Midwife | Admitting: Certified Nurse Midwife

## 2024-03-21 ENCOUNTER — Ambulatory Visit (HOSPITAL_BASED_OUTPATIENT_CLINIC_OR_DEPARTMENT_OTHER): Payer: Self-pay | Admitting: Certified Nurse Midwife

## 2024-03-21 ENCOUNTER — Encounter (HOSPITAL_BASED_OUTPATIENT_CLINIC_OR_DEPARTMENT_OTHER): Payer: Self-pay | Admitting: Certified Nurse Midwife

## 2024-03-21 VITALS — BP 105/69 | HR 71 | Ht 63.0 in | Wt 119.4 lb

## 2024-03-21 DIAGNOSIS — Z30430 Encounter for insertion of intrauterine contraceptive device: Secondary | ICD-10-CM

## 2024-03-21 DIAGNOSIS — Z113 Encounter for screening for infections with a predominantly sexual mode of transmission: Secondary | ICD-10-CM | POA: Insufficient documentation

## 2024-03-21 DIAGNOSIS — Z1151 Encounter for screening for human papillomavirus (HPV): Secondary | ICD-10-CM

## 2024-03-21 DIAGNOSIS — Z124 Encounter for screening for malignant neoplasm of cervix: Secondary | ICD-10-CM | POA: Insufficient documentation

## 2024-03-21 MED ORDER — LEVONORGESTREL 20 MCG/DAY IU IUD
1.0000 | INTRAUTERINE_SYSTEM | Freq: Once | INTRAUTERINE | Status: AC
Start: 2024-03-21 — End: 2024-03-21
  Administered 2024-03-21: 1 via INTRAUTERINE

## 2024-03-21 NOTE — Progress Notes (Signed)
 GYNECOLOGY  VISIT  CC:   Pt would like STD Screening and Insertion Mirena  IUD. Periods can be heavy (menorrhagia). She declines serum testing (HIV/HCV/HB/RPR) but desires GC/CT/TV.  HPI: 23 y.o. [id number] Single Other or two or more races female here for above.   Past Medical History:  Diagnosis Date   COVID    Dysuria    MDD (major depressive disorder)    Medical history non-contributory    Mood changes    UTI (urinary tract infection)    Vasovagal syncope    Viral syndrome     MEDS:   Current Outpatient Medications on File Prior to Visit  Medication Sig Dispense Refill   ibuprofen  (ADVIL ) 600 MG tablet Take 1 tablet (600 mg total) by mouth every 6 (six) hours. 30 tablet 0   Prenatal Vit-Fe Fumarate-FA (MULTIVITAMIN-PRENATAL) 27-0.8 MG TABS tablet Take 1 tablet by mouth daily at 12 noon.     No current facility-administered medications on file prior to visit.    ALLERGIES: Patient has no known allergies.  SH:  Works full time [employer], great support system, 1yo son, 2yo daughter both healthy and doing well  Review of Systems  Constitutional: Negative.   Genitourinary:        Heavy periods  All other systems reviewed and are negative.   PHYSICAL EXAMINATION:    BP 105/69   Pulse 71   Ht 5' 3 (1.6 m)   Wt 119 lb 6.4 oz (54.2 kg)   LMP 03/21/2024   BMI 21.15 kg/m     General appearance: alert, cooperative and appears stated age Abdomen: soft, non-tender; bowel sounds normal; no masses,  no organomegaly   Pelvic: External genitalia:  no lesions              Urethra:  normal appearing urethra with no masses, tenderness or lesions              Bartholins and Skenes: normal                 Vagina: Normal (having a period-heavy bleeding today)              Cervix: multiparous appearance, no cervical motion tenderness, and no lesions              Bimanual Exam:  Uterus:  normal size, contour, position, consistency, mobility, non-tender              Adnexa:  no mass, fullness, tenderness              Anus:  normal sphincter tone, no lesions  Chaperone,  CMA, was present for exam.  Assessment/Plan: 1. Encounter for screening for human papillomavirus (HPV) (Primary) - Cytology - PAP( [place])  2. Screen for STD (sexually transmitted disease) - Cytology - PAP( [place]) - RPR - Hepatitis B Surface AntiGEN - Hepatitis C Antibody - HIV antibody (with reflex)  3. Cervical cancer screening - Cytology - PAP( [place])  4. Encounter for insertion of Mirena  IUD    IUD Procedure Note Patient identified, informed consent performed.  Discussed risks of irregular bleeding, cramping, infection, malpositioning or misplacement of the IUD outside the uterus which may require further procedures. Time out was performed.  Urine pregnancy test negative.  Speculum placed in the vagina.  Cervix visualized.  Cleaned with Betadine x 2.  Grasped anteriorly with a single tooth tenaculum.  Uterus sounded to 6 cm.  Mirena  IUD placed per manufacturer's recommendations.  Strings  trimmed to 3 cm. Tenaculum was removed, good hemostasis noted.  Patient tolerated procedure well.   Patient was given post-procedure instructions and the Mirena  care card with expiration date.  Patient was also asked to check IUD strings periodically and follow up in 4-6 weeks for IUD check.  Arland MARLA Roller

## 2024-03-27 ENCOUNTER — Ambulatory Visit (HOSPITAL_BASED_OUTPATIENT_CLINIC_OR_DEPARTMENT_OTHER): Payer: Self-pay | Admitting: Certified Nurse Midwife

## 2024-03-27 DIAGNOSIS — A749 Chlamydial infection, unspecified: Secondary | ICD-10-CM

## 2024-03-27 LAB — CYTOLOGY - PAP
Chlamydia: POSITIVE — AB
Comment: NEGATIVE
Comment: NEGATIVE
Comment: NEGATIVE
Comment: NORMAL
Diagnosis: NEGATIVE
High risk HPV: NEGATIVE
Neisseria Gonorrhea: NEGATIVE
Trichomonas: NEGATIVE

## 2024-03-27 MED ORDER — DOXYCYCLINE HYCLATE 100 MG PO CAPS: 100.0000 mg | ORAL_CAPSULE | Freq: Two times a day (BID) | ORAL | 0 refills | Status: DC

## 2024-05-07 ENCOUNTER — Ambulatory Visit (HOSPITAL_COMMUNITY)
Admission: EM | Admit: 2024-05-07 | Discharge: 2024-05-07 | Disposition: A | Discharge status: Home / Self Care | Payer: Self-pay | Attending: Emergency Medicine | Admitting: Emergency Medicine

## 2024-05-07 ENCOUNTER — Encounter (HOSPITAL_COMMUNITY): Payer: Self-pay

## 2024-05-07 DIAGNOSIS — L292 Pruritus vulvae: Secondary | ICD-10-CM | POA: Insufficient documentation

## 2024-05-07 DIAGNOSIS — Z113 Encounter for screening for infections with a predominantly sexual mode of transmission: Secondary | ICD-10-CM | POA: Insufficient documentation

## 2024-05-07 DIAGNOSIS — N939 Abnormal uterine and vaginal bleeding, unspecified: Secondary | ICD-10-CM | POA: Insufficient documentation

## 2024-05-07 DIAGNOSIS — N898 Other specified noninflammatory disorders of vagina: Secondary | ICD-10-CM | POA: Insufficient documentation

## 2024-05-07 LAB — HIV ANTIBODY (ROUTINE TESTING W REFLEX): HIV Screen 4th Generation wRfx: NONREACTIVE

## 2024-05-07 LAB — POCT URINE PREGNANCY: Preg Test, Ur: NEGATIVE

## 2024-05-07 NOTE — ED Triage Notes (Signed)
 Patient presenting with vaginal odor, itching, and vaginal bleeding. Denies any pain, discharge, or dysuria. Onset of symptoms this past Saturday.   Patient requesting full panel STI testing to include swab and blood draw.

## 2024-05-07 NOTE — ED Provider Notes (Signed)
 [facility]    CSN: [id number] Arrival date & time: 05/07/24  1343      History   Chief Complaint Chief Complaint  Patient presents with   Vaginal Itching   Vaginal Bleeding    HPI Tammie Scott is a 23 y.o. female.  Here with 1 day history of symptoms  Vaginal discharge, itching, odor, irritation, and light spotting  Had IUD inserted 2 weeks ago on 9/16 Also tested positive for chlamydia at that visit. Treated and took all the meds.   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 Authorizing Provider  levonorgestrel  (MIRENA ) 20 MCG/DAY IUD 1 each by Intrauterine route once.   Yes [provider]    Family History Family History  Problem Relation Age of Onset   Epilepsy Brother    Asthma Brother    Diabetes Maternal Grandfather     Social History Social History   Tobacco Use    Smoking status: Former    Current packs/day: 0.00    Types: Cigarettes    Quit date: 09/2020    Years since quitting: 3.6   Smokeless tobacco: Never  Vaping Use   Vaping status: Former   Quit date: 02/10/2021  Substance Use Topics   Alcohol use: Never   Drug use: Not Currently    Types: Marijuana    Comment: Pt hasn't smoked in years     Allergies   Patient has no known allergies.   Review of Systems Review of Systems As per HPI  Physical Exam Triage Vital Signs ED Triage Vitals  Encounter Vitals Group     BP 05/07/24 1514 102/67     Girls Systolic BP Percentile --      Girls Diastolic BP Percentile --      Boys Systolic BP Percentile --      Boys Diastolic BP Percentile --      Pulse Rate 05/07/24 1514 80     Resp 05/07/24 1514 18     Temp 05/07/24 1514 98 F (36.7 C)  Temp Source 05/07/24 1514 Oral     SpO2 05/07/24 1514 97 %     Weight --      Height 05/07/24 1514 5' 3 (1.6 m)     Head Circumference --      Peak Flow --      Pain Score 05/07/24 1512 0     Pain Loc --      Pain Education --      Exclude from Growth Chart --    No data found.  Updated Vital Signs BP 102/67 (BP Location: Left Arm)   Pulse 80   Temp 98 F (36.7 C) (Oral)   Resp 18   Ht 5' 3 (1.6 m)   LMP 04/24/2024 (Approximate)   SpO2 97%   Breastfeeding No   BMI 21.15 kg/m   Physical Exam Vitals and nursing note reviewed.  Constitutional:      General: She is not in acute distress. HENT:     Mouth/Throat:     Mouth: Mucous membranes are moist.     Pharynx: Oropharynx is clear.  Eyes:     Conjunctiva/sclera: Conjunctivae normal.     Pupils: Pupils are equal, round, and reactive to light.  Cardiovascular:     Rate and Rhythm: Normal rate and regular rhythm.     Heart sounds: Normal heart sounds.  Pulmonary:     Effort: Pulmonary effort is normal.     Breath sounds: Normal breath sounds.  Abdominal:     Palpations: Abdomen is soft.     Tenderness: There is no  abdominal tenderness. There is no right CVA tenderness, left CVA tenderness or guarding.  Neurological:     Mental Status: She is alert and oriented to person, place, and time.     UC Treatments / Results  Labs (all labs ordered are listed, but only abnormal results are displayed) Labs Reviewed  RPR  HIV ANTIBODY (ROUTINE TESTING W REFLEX)  POCT URINE PREGNANCY  CERVICOVAGINAL ANCILLARY ONLY    EKG  Radiology No results found.  Procedures Procedures   Medications Ordered in UC Medications - No data to display  Initial Impression / Assessment and Plan / UC Course  I have reviewed the triage vital signs and the nursing notes.  Pertinent labs & imaging results that were available during my care of the patient were reviewed by me and considered in my medical decision making (see chart for details).  UPT negative Spotting is likely from IUD insertion 2 weeks ago. Chlamydia was positive 2 weeks ago. Discussed her test can remain positive for up to 6 weeks after treatment. If chlamydia returns positive this swab, NO TREATMENT will be needed. Instead I would recommend return at the 6 week period to determine if active infection. Unfortunately I cannot order gonorrhea testing alone so the chlamydia will have to be ordered as well. HIV and RPR pending. Safe sex precautions. Patient agrees to plan, questions answered   Final Clinical Impressions(s) / UC Diagnoses   Final diagnoses:  Screen for STD (sexually transmitted disease)  Vaginal irritation  Vaginal spotting     Discharge Instructions      We will call you if anything on your swab returns positive and requires treatment. You can also see these results on MyChart. Please abstain from sexual intercourse until your results return, and for 7 days after treatment if treatment is needed.  You had positive chlamydia only 2 weeks ago on September 16th. Unfortunately the test can remain positive for up  to 6 weeks after treatment.  If your chlamydia test comes back positive again, you will NOT be treated -- instead we will recommend you return around the 6 week mark for re-testing.      ED Prescriptions   None    PDMP not reviewed this encounter.   Daisuke Bailey, [place], PA-C 05/07/24 1600

## 2024-05-07 NOTE — Discharge Instructions (Addendum)
 We will call you if anything on your swab returns positive and requires treatment. You can also see these results on MyChart. Please abstain from sexual intercourse until your results return, and for 7 days after treatment if treatment is needed.  You had positive chlamydia only 2 weeks ago on September 16th. Unfortunately the test can remain positive for up to 6 weeks after treatment. If your chlamydia test comes back positive again, you will NOT be treated -- instead we will recommend you return around the 6 week mark for re-testing.

## 2024-05-08 ENCOUNTER — Ambulatory Visit (HOSPITAL_COMMUNITY): Payer: Self-pay

## 2024-05-08 LAB — CERVICOVAGINAL ANCILLARY ONLY
Bacterial Vaginitis (gardnerella): POSITIVE — AB
Candida Glabrata: NEGATIVE
Candida Vaginitis: NEGATIVE
Chlamydia: NEGATIVE
Comment: NEGATIVE
Comment: NEGATIVE
Comment: NEGATIVE
Comment: NEGATIVE
Comment: NEGATIVE
Comment: NORMAL
Neisseria Gonorrhea: NEGATIVE
Trichomonas: NEGATIVE

## 2024-05-08 LAB — RPR
RPR Ser Ql: REACTIVE — AB
RPR Titer: 1:4 {titer}

## 2024-05-08 MED ORDER — METRONIDAZOLE 500 MG PO TABS: 500.0000 mg | ORAL_TABLET | Freq: Two times a day (BID) | ORAL | 0 refills | Status: AC

## 2024-05-09 LAB — T.PALLIDUM AB, TOTAL: T Pallidum Abs: NONREACTIVE

## 2024-07-22 ENCOUNTER — Ambulatory Visit (HOSPITAL_COMMUNITY)
Admission: EM | Admit: 2024-07-22 | Discharge: 2024-07-22 | Disposition: A | Discharge status: Home / Self Care | Payer: Self-pay | Attending: Nurse Practitioner | Admitting: Nurse Practitioner

## 2024-07-22 ENCOUNTER — Encounter (HOSPITAL_COMMUNITY): Payer: Self-pay

## 2024-07-22 DIAGNOSIS — Z113 Encounter for screening for infections with a predominantly sexual mode of transmission: Secondary | ICD-10-CM | POA: Insufficient documentation

## 2024-07-22 DIAGNOSIS — L304 Erythema intertrigo: Secondary | ICD-10-CM | POA: Insufficient documentation

## 2024-07-22 LAB — POCT URINE PREGNANCY: Preg Test, Ur: NEGATIVE

## 2024-07-22 LAB — POCT URINE DIPSTICK
Bilirubin, UA: NEGATIVE
Glucose, UA: NEGATIVE mg/dL
Ketones, POC UA: NEGATIVE mg/dL
Leukocytes, UA: NEGATIVE
Nitrite, UA: NEGATIVE
POC PROTEIN,UA: NEGATIVE
Spec Grav, UA: >=1.03 — AB
Urobilinogen, UA: 0.2 U/dL
pH, UA: 5.5

## 2024-07-22 LAB — HIV ANTIBODY (ROUTINE TESTING W REFLEX): HIV Screen 4th Generation wRfx: NONREACTIVE

## 2024-07-22 MED ORDER — ZINC OXIDE 20 % EX OINT: TOPICAL_OINTMENT | CUTANEOUS | 0 refills | Status: AC

## 2024-07-22 NOTE — ED Provider Notes (Signed)
 " [facility]    CSN: [id number] Arrival date & time: 07/22/24  0858      History   Chief Complaint Chief Complaint  Patient presents with   SEXUALLY TRANSMITTED DISEASE   Vaginal Itching    HPI Tammie Scott is a 24 y.o. female.   Discussed the use of AI scribe software for clinical note transcription with the patient, who gave verbal consent to proceed.   Zyra presents for STD testing after her sexual partner was notified by a previous partner that she tested positive for trichomonas. The patient reports that yesterday she noticed some peeling skin in the groin area, described as being on the outside fold area lower than the vaginal opening. The peeling was associated with itching for approximately one day, but the itching has since resolved. She denies any vaginal discharge, odor, vaginal itching or irritation, sores, bumps, rashes in the vaginal area, pain or discomfort with urination, nausea, vomiting, lower back pain, abdominal pain, or fevers. The patient reports irregular spotting related to her IUD, making it difficult to assess for abnormal discharge. She has had two female sexual partners in the past three months and reports using condoms with the previous partner but not with her current partner. Both the patient and her current sexual partner deny having any symptoms; however, patient is requesting complete STD testing.   The following sections of the patient's history were reviewed and updated as appropriate: allergies, current medications, past family history, past medical history, past social history, past surgical history, and problem list.     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 Authorizing Provider  levonorgestrel  (MIRENA ) 20 MCG/DAY IUD 1 each by Intrauterine route once.   Yes [provider]  zinc  oxide ([employer]IJER ZINC  OXIDE) 20 % ointment Apply a thin layer to clean, dry skin in the groin area twice daily to protect against irritation 07/22/24  Yes Iola Lukes, FNP    Family History Family History  Problem Relation Age of Onset   Epilepsy Brother    Asthma Brother    Diabetes Maternal Grandfather     Social History  Social History[1]   Allergies   Patient has no allergy information on record.   Review of Systems Review of Systems  Constitutional:  Negative for fever.  Gastrointestinal:  Negative for abdominal pain, nausea and vomiting.  Genitourinary:  Negative for dysuria, genital sores, menstrual problem (Irregular spotting due to IUD) and vaginal discharge.       Skin peeling around the vaginal area. Initially was itchy but now causes no discomfort. No vaginal itching, irritation or odor.   Musculoskeletal:  Negative for back pain.  All other systems reviewed and are negative.    Physical Exam Triage Vital Signs ED Triage Vitals [07/22/24 0926]  Encounter Vitals Group     BP 124/72     Girls Systolic BP Percentile      Girls Diastolic  BP Percentile      Boys Systolic BP Percentile      Boys Diastolic BP Percentile      Pulse Rate 100     Resp 18     Temp 98.1 F (36.7 C)     Temp Source Oral     SpO2 98 %     Weight      Height      Head Circumference      Peak Flow      Pain Score      Pain Loc      Pain Education      Exclude from Growth Chart    No data found.  Updated Vital Signs BP 124/72 (BP Location: Left Arm)   Pulse 100   Temp 98.1 F (36.7 C) (Oral)   Resp 18   LMP  (Exact Date)   SpO2 98%   Visual Acuity Right Eye Distance:   Left Eye Distance:   Bilateral Distance:    Right Eye Near:   Left Eye Near:    Bilateral Near:     Physical Exam Exam conducted with a chaperone present ANCEL Molly, CMA).  Constitutional:      General: She is not in acute distress.    Appearance: Normal appearance. She is not ill-appearing, toxic-appearing or diaphoretic.  HENT:     Head: Normocephalic.     Nose: Nose normal.     Mouth/Throat:     Mouth: Mucous membranes are moist.  Eyes:     Conjunctiva/sclera: Conjunctivae normal.  Cardiovascular:     Rate and Rhythm: Normal rate.  Pulmonary:     Effort: Pulmonary effort is normal.  Abdominal:     Palpations: Abdomen is soft.  Genitourinary:    General: Normal vulva.     Pubic Area: Rash present.     Labia:        Right: No rash or lesion.        Left: No rash or lesion.       Comments: Mild superficial irritation within the skin folds. Minimal peeling and faint erythema without maceration. No satellite lesions, pustules, fissures, drainage, odor or swelling. Surrounding skin is dry and intact. Patient performed vaginal self-swab for Aptima testing.  Musculoskeletal:        General: Normal range of motion.     Cervical back: Normal range of motion and neck supple.  Skin:    General: Skin is warm and dry.  Neurological:     General: No focal deficit present.     Mental Status: She is alert and oriented to person, place, and time.   Psychiatric:        Mood and  Affect: Mood normal.        Behavior: Behavior normal.      UC Treatments / Results  Labs (all labs ordered are listed, but only abnormal results are displayed) Labs Reviewed  POCT URINE DIPSTICK - Abnormal; Notable for the following components:      Result Value   Spec Grav, UA >=1.030 (*)    Blood, UA moderate (*)    All other components within normal limits  HIV ANTIBODY (ROUTINE TESTING W REFLEX)  SYPHILIS: RPR W/REFLEX TO RPR TITER AND TREPONEMAL ANTIBODIES, TRADITIONAL SCREENING AND DIAGNOSIS ALGORITHM  POCT URINE PREGNANCY  CERVICOVAGINAL ANCILLARY ONLY    EKG   Radiology No results found.  Procedures Procedures (including critical care time)  Medications Ordered in UC Medications - No data to display  Initial Impression / Assessment and Plan / UC Course  I have reviewed the triage vital signs and the nursing notes.  Pertinent labs & imaging results that were available during my care of the patient were reviewed by me and considered in my medical decision making (see chart for details).    The patient presents for evaluation of possible sexually transmitted infection exposure after learning that a sexual partner was notified by a prior partner of a positive trichomonas test. The patient is currently asymptomatic and denies vaginal discharge, odor, dysuria, pelvic pain, or systemic symptoms. Sexual history includes two female partners in the past three months with inconsistent condom use. Given the reported exposure and risk factors, STI screening is indicated. Comprehensive testing was completed, including a self-collected vaginal swab for gonorrhea, chlamydia, and trichomonas, as well as blood testing for HIV and syphilis. The patient was counseled to abstain from sexual activity until test results are available.  The patient also presents with recent onset skin peeling in the groin fold associated with brief itching that has since  resolved. Physical examination demonstrates mild friction-related irritation within the skin fold consistent with intertrigo, without erythema, satellite lesions, drainage, odor, or other findings concerning for superimposed fungal or bacterial infection. Management focuses on moisture and friction control. The patient was advised to keep the affected area clean and thoroughly dry, wear loose-fitting and breathable clothing, and allow the skin folds to air out when possible. A zinc  oxide barrier cream application twice daily to protect the skin and promote healing.  Today's evaluation has revealed no signs of a dangerous process. Discussed diagnosis with patient and/or guardian. Patient and/or guardian aware of their diagnosis, possible red flag symptoms to watch out for and need for close follow up. Patient and/or guardian understands verbal and written discharge instructions. Patient and/or guardian comfortable with plan and disposition.  Patient and/or guardian has a clear mental status at this time, good insight into illness (after discussion and teaching) and has clear judgment to make decisions regarding their care  Documentation was completed with the aid of voice recognition software. Transcription may contain typographical errors.    Final Clinical Impressions(s) / UC Diagnoses   Final diagnoses:  Intertrigo of genitocrural region  Screening examination for STD (sexually transmitted disease)     Discharge Instructions      You were seen today because of possible exposure to a sexually transmitted infection after learning that a sexual partner may have been exposed to trichomonas. At this time, you do not have symptoms such as vaginal discharge, odor, pain with urination, pelvic pain, or fever, which is reassuring. Testing was completed today, including a vaginal swab to check for gonorrhea, chlamydia, and  trichomonas, as well as blood tests for HIV and syphilis. These tests were done to  be thorough based on your reported exposure. Please avoid all sexual activity until your results are available and reviewed. Your results will be released through MyChart, and you will be contacted directly if any results are positive and treatment is needed.  You were also evaluated for skin irritation in the groin area. The peeling and brief itching are consistent with mild intertrigo, which is irritation caused by moisture and friction in skin folds. This is not a yeast or bacterial infection. To help the area heal, keep the skin clean and completely dry, wear loose and breathable clothing, and allow the area to air out when possible. Apply zinc  oxide barrier cream twice daily to protect the skin and reduce irritation. Avoid tight clothing, prolonged moisture, and heavy sweating when possible.  Follow up with your primary care provider or gynecologist if your STI testing is positive, if symptoms develop, or if the skin irritation does not improve over the next several days. Go to the emergency department right away if you develop severe lower abdominal or pelvic pain, fever, vomiting, abnormal vaginal bleeding, rapidly spreading redness, increasing pain, drainage, or any other sudden or concerning symptoms.     ED Prescriptions     Medication Sig Dispense Auth. Provider   zinc  oxide [employer] ZINC  OXIDE) 20 % ointment Apply a thin layer to clean, dry skin in the groin area twice daily to protect against irritation 56.7 g Iola Lukes, FNP      PDMP not reviewed this encounter.     [1]  Social History Tobacco Use   Smoking status: Former    Current packs/day: 0.00    Types: Cigarettes    Quit date: 09/2020    Years since quitting: 3.8   Smokeless tobacco: Never  Vaping Use   Vaping status: Former   Quit date: 02/10/2021  Substance Use Topics   Alcohol use: Never   Drug use: Not Currently    Types: Marijuana    Comment: Pt hasn't smoked in years     Iola Lukes,  [place] 07/22/24 1027  "

## 2024-07-22 NOTE — Discharge Instructions (Signed)
 You were seen today because of possible exposure to a sexually transmitted infection after learning that a sexual partner may have been exposed to trichomonas. At this time, you do not have symptoms such as vaginal discharge, odor, pain with urination, pelvic pain, or fever, which is reassuring. Testing was completed today, including a vaginal swab to check for gonorrhea, chlamydia, and trichomonas, as well as blood tests for HIV and syphilis. These tests were done to be thorough based on your reported exposure. Please avoid all sexual activity until your results are available and reviewed. Your results will be released through MyChart, and you will be contacted directly if any results are positive and treatment is needed.  You were also evaluated for skin irritation in the groin area. The peeling and brief itching are consistent with mild intertrigo, which is irritation caused by moisture and friction in skin folds. This is not a yeast or bacterial infection. To help the area heal, keep the skin clean and completely dry, wear loose and breathable clothing, and allow the area to air out when possible. Apply zinc  oxide barrier cream twice daily to protect the skin and reduce irritation. Avoid tight clothing, prolonged moisture, and heavy sweating when possible.  Follow up with your primary care provider or gynecologist if your STI testing is positive, if symptoms develop, or if the skin irritation does not improve over the next several days. Go to the emergency department right away if you develop severe lower abdominal or pelvic pain, fever, vomiting, abnormal vaginal bleeding, rapidly spreading redness, increasing pain, drainage, or any other sudden or concerning symptoms.

## 2024-07-22 NOTE — ED Triage Notes (Signed)
 Patient presents to the office for STI testing. PT states she has some peeling around her vaginal area.

## 2024-07-23 LAB — SYPHILIS: RPR W/REFLEX TO RPR TITER AND TREPONEMAL ANTIBODIES, TRADITIONAL SCREENING AND DIAGNOSIS ALGORITHM
RPR Ser Ql: REACTIVE — AB
RPR Titer: 1:4 {titer}

## 2024-07-24 ENCOUNTER — Ambulatory Visit (HOSPITAL_COMMUNITY): Payer: Self-pay

## 2024-07-24 LAB — CERVICOVAGINAL ANCILLARY ONLY
Bacterial Vaginitis (gardnerella): NEGATIVE
Candida Glabrata: NEGATIVE
Candida Vaginitis: POSITIVE — AB
Chlamydia: NEGATIVE
Comment: NEGATIVE
Comment: NEGATIVE
Comment: NEGATIVE
Comment: NEGATIVE
Comment: NEGATIVE
Comment: NORMAL
Neisseria Gonorrhea: NEGATIVE
Trichomonas: NEGATIVE

## 2024-07-24 LAB — T.PALLIDUM AB, TOTAL: T Pallidum Abs: NONREACTIVE

## 2024-07-24 MED ORDER — FLUCONAZOLE 150 MG PO TABS: 150.0000 mg | ORAL_TABLET | Freq: Once | ORAL | 0 refills | Status: AC
